# Patient Record
Sex: Male | Born: 1960
Health system: Southern US, Community
[De-identification: ages and names within clinical notes are randomized; demographics above are authoritative.]

## PROBLEM LIST (undated history)

## (undated) DIAGNOSIS — M545 Low back pain, unspecified: Secondary | ICD-10-CM

## (undated) DIAGNOSIS — I10 Essential (primary) hypertension: Secondary | ICD-10-CM

## (undated) HISTORY — PX: MOHS SURGERY: SUR867

## (undated) HISTORY — DX: Low back pain: M54.5

## (undated) HISTORY — PX: SKIN LESION EXCISION: SHX2412

## (undated) HISTORY — DX: Low back pain, unspecified: M54.50

## (undated) HISTORY — DX: Essential (primary) hypertension: I10

## (undated) HISTORY — PX: SKIN BIOPSY: SHX1

---

## 2012-01-14 ENCOUNTER — Ambulatory Visit: Payer: Self-pay | Admitting: Family Medicine

## 2014-01-04 ENCOUNTER — Ambulatory Visit: Payer: Self-pay | Admitting: Gastroenterology

## 2014-01-04 LAB — HM COLONOSCOPY

## 2014-10-28 DIAGNOSIS — I10 Essential (primary) hypertension: Secondary | ICD-10-CM | POA: Insufficient documentation

## 2014-10-29 ENCOUNTER — Ambulatory Visit (INDEPENDENT_AMBULATORY_CARE_PROVIDER_SITE_OTHER): Payer: BLUE CROSS/BLUE SHIELD | Admitting: Family Medicine

## 2014-10-29 ENCOUNTER — Encounter: Payer: Self-pay | Admitting: Family Medicine

## 2014-10-29 VITALS — BP 122/79 | HR 50 | Temp 97.5°F | Ht 70.6 in | Wt 238.0 lb

## 2014-10-29 DIAGNOSIS — I10 Essential (primary) hypertension: Secondary | ICD-10-CM

## 2014-10-29 DIAGNOSIS — E291 Testicular hypofunction: Secondary | ICD-10-CM | POA: Diagnosis not present

## 2014-10-29 DIAGNOSIS — Z0000 Encounter for general adult medical examination without abnormal findings: Secondary | ICD-10-CM | POA: Diagnosis not present

## 2014-10-29 LAB — URINALYSIS, ROUTINE W REFLEX MICROSCOPIC
Bilirubin, UA: NEGATIVE
GLUCOSE, UA: NEGATIVE
KETONES UA: NEGATIVE
Leukocytes, UA: NEGATIVE
NITRITE UA: NEGATIVE
Protein, UA: NEGATIVE
SPEC GRAV UA: 1.005 (ref 1.005–1.030)
UUROB: 0.2 mg/dL (ref 0.2–1.0)
pH, UA: 6 (ref 5.0–7.5)

## 2014-10-29 LAB — MICROSCOPIC EXAMINATION

## 2014-10-29 MED ORDER — BENAZEPRIL HCL 40 MG PO TABS: 40.0000 mg | ORAL_TABLET | Freq: Every day | ORAL | Status: DC

## 2014-10-29 NOTE — Assessment & Plan Note (Signed)
The current medical regimen is effective;  continue present plan and medications.  

## 2014-10-29 NOTE — Progress Notes (Signed)
   BP 122/79 mmHg  Pulse 50  Temp(Src) 97.5 F (36.4 C)  Ht 5' 10.6" (1.793 m)  Wt 238 lb (107.956 kg)  BMI 33.58 kg/m2  SpO2 98%   Subjective:    Patient ID: Larry Cochran, male    DOB: 04/19/1960, 54 y.o.   MRN: [id number]  HPI: Larry Cochran is a 54 y.o. male  Chief Complaint  Patient presents with  . Annual Exam  for BP check doing well   Relevant past medical, surgical, family and social history reviewed and updated as indicated. Interim medical history since our last visit reviewed. Allergies and medications reviewed and updated.  Review of Systems  Constitutional: Negative.   HENT: Negative.   Eyes: Negative.   Respiratory: Negative.   Cardiovascular: Negative.   Endocrine: Negative.   Musculoskeletal: Negative.   Skin: Negative.   Allergic/Immunologic: Negative.   Neurological: Negative.   Hematological: Negative.   Psychiatric/Behavioral: Negative.     Per HPI unless specifically indicated above     Objective:    BP 122/79 mmHg  Pulse 50  Temp(Src) 97.5 F (36.4 C)  Ht 5' 10.6" (1.793 m)  Wt 238 lb (107.956 kg)  BMI 33.58 kg/m2  SpO2 98%  Wt Readings from Last 3 Encounters:  10/29/14 238 lb (107.956 kg)  04/15/14 238 lb (107.956 kg)    Physical Exam  Constitutional: He is oriented to person, place, and time. He appears well-developed and well-nourished.  HENT:  Head: Normocephalic and atraumatic.  Right Ear: External ear normal.  Left Ear: External ear normal.  Eyes: Conjunctivae and EOM are normal. Pupils are equal, round, and reactive to light.  Neck: Normal range of motion. Neck supple.  Cardiovascular: Normal rate, regular rhythm, normal heart sounds and intact distal pulses.   Pulmonary/Chest: Effort normal and breath sounds normal.  Abdominal: Soft. Bowel sounds are normal. There is no splenomegaly or hepatomegaly.  Genitourinary: Rectum normal, prostate normal and penis normal.  Musculoskeletal: Normal range of motion.   Neurological: He is alert and oriented to person, place, and time. He has normal reflexes.  Skin: No rash noted. No erythema.  Psychiatric: He has a normal mood and affect. His behavior is normal. Judgment and thought content normal.    No results found for this or any previous visit.    Assessment & Plan:   Problem List Items Addressed This Visit      Cardiovascular and Mediastinum   Hypertension    The current medical regimen is effective;  continue present plan and medications.       Relevant Medications   sildenafil (REVATIO) 20 MG tablet   benazepril (LOTENSIN) 40 MG tablet   Other Relevant Orders   Basic metabolic panel     Endocrine   Hypogonadism in male - Primary    Other Visit Diagnoses    PE (physical exam), annual        Relevant Orders    Comprehensive metabolic panel    Lipid panel    CBC with Differential/Platelet    PSA    Urinalysis, Routine w reflex microscopic (not at [facility])    TSH        Follow up plan: Return in about 6 months (around 05/01/2015), or if symptoms worsen or fail to improve, for BP check.

## 2014-10-30 ENCOUNTER — Encounter: Payer: Self-pay | Admitting: Family Medicine

## 2014-10-30 LAB — CBC WITH DIFFERENTIAL/PLATELET
BASOS ABS: 0 10*3/uL (ref 0.0–0.2)
Basos: 0 %
EOS (ABSOLUTE): 0.1 10*3/uL (ref 0.0–0.4)
Eos: 2 %
HEMOGLOBIN: 14.4 g/dL (ref 12.6–17.7)
Hematocrit: 43 % (ref 37.5–51.0)
Immature Grans (Abs): 0 10*3/uL (ref 0.0–0.1)
Immature Granulocytes: 0 %
LYMPHS ABS: 2.4 10*3/uL (ref 0.7–3.1)
LYMPHS: 35 %
MCH: 28.6 pg (ref 26.6–33.0)
MCHC: 33.5 g/dL (ref 31.5–35.7)
MCV: 86 fL (ref 79–97)
MONOCYTES: 6 %
Monocytes Absolute: 0.4 10*3/uL (ref 0.1–0.9)
NEUTROS ABS: 3.8 10*3/uL (ref 1.4–7.0)
Neutrophils: 57 %
PLATELETS: 278 10*3/uL (ref 150–379)
RBC: 5.03 x10E6/uL (ref 4.14–5.80)
RDW: 14.3 % (ref 12.3–15.4)
WBC: 6.8 10*3/uL (ref 3.4–10.8)

## 2014-10-30 LAB — COMPREHENSIVE METABOLIC PANEL
A/G RATIO: 2.6 — AB (ref 1.1–2.5)
ALT: 26 IU/L (ref 0–44)
AST: 13 IU/L (ref 0–40)
Albumin: 4.6 g/dL (ref 3.5–5.5)
Alkaline Phosphatase: 74 IU/L (ref 39–117)
BUN/Creatinine Ratio: 16 (ref 9–20)
BUN: 15 mg/dL (ref 6–24)
Bilirubin Total: 0.4 mg/dL (ref 0.0–1.2)
CALCIUM: 9 mg/dL (ref 8.7–10.2)
CO2: 23 mmol/L (ref 18–29)
CREATININE: 0.95 mg/dL (ref 0.76–1.27)
Chloride: 97 mmol/L (ref 97–108)
GFR, EST AFRICAN AMERICAN: 106 mL/min/{1.73_m2} (ref >59)
GFR, EST NON AFRICAN AMERICAN: 92 mL/min/{1.73_m2} (ref >59)
GLOBULIN, TOTAL: 1.8 g/dL (ref 1.5–4.5)
Glucose: 105 mg/dL — ABNORMAL HIGH (ref 65–99)
POTASSIUM: 4.2 mmol/L (ref 3.5–5.2)
SODIUM: 139 mmol/L (ref 134–144)
TOTAL PROTEIN: 6.4 g/dL (ref 6.0–8.5)

## 2014-10-30 LAB — LIPID PANEL
CHOL/HDL RATIO: 6.4 ratio — AB (ref 0.0–5.0)
Cholesterol, Total: 230 mg/dL — ABNORMAL HIGH (ref 100–199)
HDL: 36 mg/dL — AB (ref >39)
LDL CALC: 150 mg/dL — AB (ref 0–99)
TRIGLYCERIDES: 221 mg/dL — AB (ref 0–149)
VLDL Cholesterol Cal: 44 mg/dL — ABNORMAL HIGH (ref 5–40)

## 2014-10-30 LAB — PSA: PROSTATE SPECIFIC AG, SERUM: 2 ng/mL (ref 0.0–4.0)

## 2014-10-30 LAB — TSH: TSH: 1.33 u[IU]/mL (ref 0.450–4.500)

## 2015-01-08 ENCOUNTER — Other Ambulatory Visit: Payer: Self-pay | Admitting: Family Medicine

## 2015-01-09 ENCOUNTER — Telehealth: Payer: Self-pay

## 2015-01-09 MED ORDER — BENAZEPRIL HCL 40 MG PO TABS: 40.0000 mg | ORAL_TABLET | Freq: Every day | ORAL | Status: DC

## 2015-01-09 NOTE — Telephone Encounter (Signed)
Rx sent to his pharmacy

## 2015-01-09 NOTE — Telephone Encounter (Signed)
LAST VISIT: 10/29/2014 UPCOMING APPT: 04/30/2015  Request for a 90 day supply on benazepril hcl 40 mg instead of a 30 day supply.

## 2015-02-25 ENCOUNTER — Telehealth: Payer: Self-pay | Admitting: Family Medicine

## 2015-02-25 MED ORDER — AZITHROMYCIN 250 MG PO TABS: ORAL_TABLET | ORAL | Status: DC

## 2015-02-25 MED ORDER — BENZONATATE 100 MG PO CAPS: 100.0000 mg | ORAL_CAPSULE | Freq: Two times a day (BID) | ORAL | Status: DC | PRN

## 2015-02-25 NOTE — Telephone Encounter (Signed)
Patient with bad head cold congestion cough has been taking Amoxil not getting better will call in other meds

## 2015-02-25 NOTE — Telephone Encounter (Signed)
[facility], he's taken a couple days worth and doesn't feel it's working, he is still coughing. Wondered if he needs something else.  Would like Dr Dossie Arbourrissman to call him.

## 2015-04-30 ENCOUNTER — Ambulatory Visit (INDEPENDENT_AMBULATORY_CARE_PROVIDER_SITE_OTHER): Payer: BLUE CROSS/BLUE SHIELD | Admitting: Family Medicine

## 2015-04-30 ENCOUNTER — Encounter: Payer: Self-pay | Admitting: Family Medicine

## 2015-04-30 VITALS — BP 104/71 | HR 73 | Temp 98.5°F | Ht 70.0 in | Wt 237.0 lb

## 2015-04-30 DIAGNOSIS — I10 Essential (primary) hypertension: Secondary | ICD-10-CM

## 2015-04-30 DIAGNOSIS — Z113 Encounter for screening for infections with a predominantly sexual mode of transmission: Secondary | ICD-10-CM

## 2015-04-30 DIAGNOSIS — R5382 Chronic fatigue, unspecified: Secondary | ICD-10-CM | POA: Diagnosis not present

## 2015-04-30 MED ORDER — BENAZEPRIL HCL 40 MG PO TABS: 40.0000 mg | ORAL_TABLET | Freq: Every day | ORAL | Status: DC

## 2015-04-30 NOTE — Progress Notes (Signed)
BP 104/71 mmHg  Pulse 73  Temp(Src) 98.5 F (36.9 C)  Ht  (1.778 m)  Wt 237 lb (107.502 kg)  BMI 34.01 kg/m2  SpO2 97%   Subjective:    Patient ID: Larry Cochran, male    DOB: 1960/12/28, 55 y.o.   MRN: [id number]  HPI: Larry Cochran is a 55 y.o. male  Chief Complaint  Patient presents with  . Hypertension   patient follow-up hypertension doing well with no complaints taking medication no problems Testosterone replacement managed by urology and stable   wife has noticed marked snoring with marked sleep apnea is gotten worse over the last year or so Patient has night sweats thrashes at night some daytime drowsiness and fatigue  no falling asleep while driving Dry mouth at night and patient is overweight.  Relevant past medical, surgical, family and social history reviewed and updated as indicated. Interim medical history since our last visit reviewed. Allergies and medications reviewed and updated.  Review of Systems  Constitutional: Negative.   Respiratory: Negative.   Cardiovascular: Negative.     Per HPI unless specifically indicated above     Objective:    BP 104/71 mmHg  Pulse 73  Temp(Src) 98.5 F (36.9 C)  Ht  (1.778 m)  Wt 237 lb (107.502 kg)  BMI 34.01 kg/m2  SpO2 97%  Wt Readings from Last 3 Encounters:  04/30/15 237 lb (107.502 kg)  10/29/14 238 lb (107.956 kg)  04/15/14 238 lb (107.956 kg)    Physical Exam  Constitutional: He is oriented to person, place, and time. He appears well-developed and well-nourished. No distress.  HENT:  Head: Normocephalic and atraumatic.  Right Ear: Hearing normal.  Left Ear: Hearing normal.  Nose: Nose normal.  Mouth/Throat: Oropharynx is clear and moist.  Eyes: Conjunctivae and lids are normal. Right eye exhibits no discharge. Left eye exhibits no discharge. No scleral icterus.  Neck: No thyromegaly present.  Cardiovascular: Normal rate, regular rhythm and normal heart sounds.    Pulmonary/Chest: Effort normal and breath sounds normal. No respiratory distress.  Musculoskeletal: Normal range of motion.  Lymphadenopathy:    He has no cervical adenopathy.  Neurological: He is alert and oriented to person, place, and time.  Skin: Skin is intact. No rash noted.  Psychiatric: He has a normal mood and affect. His speech is normal and behavior is normal. Judgment and thought content normal. Cognition and memory are normal.    Results for orders placed or performed in visit on 10/29/14  Microscopic Examination  Result Value Ref Range   WBC, UA 0-5 0 -  5 /hpf   RBC, UA 0-2 0 -  2 /hpf   Epithelial Cells (non renal) 0-10 0 - 10 /hpf   Bacteria, UA Few None seen/Few  Comprehensive metabolic panel  Result Value Ref Range   Glucose 105 (H) 65 - 99 mg/dL   BUN 15 6 - 24 mg/dL   Creatinine, Ser 1.47 0.76 - 1.27 mg/dL   GFR calc non Af Amer 92 >59 mL/min/1.73   GFR calc Af Amer 106 >59 mL/min/1.73   BUN/Creatinine Ratio 16 9 - 20   Sodium 139 134 - 144 mmol/L   Potassium 4.2 3.5 - 5.2 mmol/L   Chloride 97 97 - 108 mmol/L   CO2 23 18 - 29 mmol/L   Calcium 9.0 8.7 - 10.2 mg/dL   Total Protein 6.4 6.0 - 8.5 g/dL   Albumin 4.6 3.5 - 5.5 g/dL   Globulin, Total  1.8 1.5 - 4.5 g/dL   Albumin/Globulin Ratio 2.6 (H) 1.1 - 2.5   Bilirubin Total 0.4 0.0 - 1.2 mg/dL   Alkaline Phosphatase 74 39 - 117 IU/L   AST 13 0 - 40 IU/L   ALT 26 0 - 44 IU/L  Lipid panel  Result Value Ref Range   Cholesterol, Total 230 (H) 100 - 199 mg/dL   Triglycerides 161 (H) 0 - 149 mg/dL   HDL 36 (L) >09 mg/dL   VLDL Cholesterol Cal 44 (H) 5 - 40 mg/dL   LDL Calculated 604 (H) 0 - 99 mg/dL   Chol/HDL Ratio 6.4 (H) 0.0 - 5.0 ratio units  CBC with Differential/Platelet  Result Value Ref Range   WBC 6.8 3.4 - 10.8 x10E3/uL   RBC 5.03 4.14 - 5.80 x10E6/uL   Hemoglobin 14.4 12.6 - 17.7 g/dL   Hematocrit 54.0 98.1 - 51.0 %   MCV 86 79 - 97 fL   MCH 28.6 26.6 - 33.0 pg   MCHC 33.5 31.5 - 35.7  g/dL   RDW 19.1 47.8 - 29.5 %   Platelets 278 150 - 379 x10E3/uL   Neutrophils 57 %   Lymphs 35 %   Monocytes 6 %   Eos 2 %   Basos 0 %   Neutrophils Absolute 3.8 1.4 - 7.0 x10E3/uL   Lymphocytes Absolute 2.4 0.7 - 3.1 x10E3/uL   Monocytes Absolute 0.4 0.1 - 0.9 x10E3/uL   EOS (ABSOLUTE) 0.1 0.0 - 0.4 x10E3/uL   Basophils Absolute 0.0 0.0 - 0.2 x10E3/uL   Immature Granulocytes 0 %   Immature Grans (Abs) 0.0 0.0 - 0.1 x10E3/uL  PSA  Result Value Ref Range   Prostate Specific Ag, Serum 2.0 0.0 - 4.0 ng/mL  Urinalysis, Routine w reflex microscopic (not at [facility])  Result Value Ref Range   Specific Gravity, UA 1.005 1.005 - 1.030   pH, UA 6.0 5.0 - 7.5   Color, UA Yellow Yellow   Appearance Ur Clear Clear   Leukocytes, UA Negative Negative   Protein, UA Negative Negative/Trace   Glucose, UA Negative Negative   Ketones, UA Negative Negative   RBC, UA Trace (A) Negative   Bilirubin, UA Negative Negative   Urobilinogen, Ur 0.2 0.2 - 1.0 mg/dL   Nitrite, UA Negative Negative   Microscopic Examination See below:   TSH  Result Value Ref Range   TSH 1.330 0.450 - 4.500 uIU/mL      Assessment & Plan:   Problem List Items Addressed This Visit      Cardiovascular and Mediastinum   Hypertension   Relevant Medications   benazepril (LOTENSIN) 40 MG tablet    Other Visit Diagnoses    Routine screening for STI (sexually transmitted infection)    -  Primary    Relevant Orders    Hepatitis C Antibody    Chronic fatigue        order sleep study    Relevant Orders    Ambulatory referral to Sleep Studies        Follow up plan: Return in about 6 months (around 10/28/2015) for Physical Exam.

## 2015-05-01 ENCOUNTER — Encounter: Payer: Self-pay | Admitting: Family Medicine

## 2015-05-01 LAB — BASIC METABOLIC PANEL
BUN / CREAT RATIO: 14 (ref 9–20)
BUN: 15 mg/dL (ref 6–24)
CHLORIDE: 98 mmol/L (ref 96–106)
CO2: 24 mmol/L (ref 18–29)
Calcium: 8.6 mg/dL — ABNORMAL LOW (ref 8.7–10.2)
Creatinine, Ser: 1.07 mg/dL (ref 0.76–1.27)
GFR calc non Af Amer: 79 mL/min/{1.73_m2} (ref >59)
GFR, EST AFRICAN AMERICAN: 91 mL/min/{1.73_m2} (ref >59)
GLUCOSE: 104 mg/dL — AB (ref 65–99)
POTASSIUM: 4.3 mmol/L (ref 3.5–5.2)
SODIUM: 139 mmol/L (ref 134–144)

## 2015-05-01 LAB — HEPATITIS C ANTIBODY

## 2015-05-22 ENCOUNTER — Telehealth: Payer: Self-pay | Admitting: Family Medicine

## 2015-05-22 ENCOUNTER — Other Ambulatory Visit: Payer: Self-pay

## 2015-05-22 DIAGNOSIS — R5382 Chronic fatigue, unspecified: Secondary | ICD-10-CM

## 2015-05-22 NOTE — Telephone Encounter (Signed)
Due to insurance, pt will need to go to [facility] instead of [facility] for sleep study. The phone number is [phone].  

## 2015-05-22 NOTE — Telephone Encounter (Signed)
Due to insurance, pt will need to go to [facility] instead of [facility] for sleep study. The phone number is [phone].

## 2015-10-10 DIAGNOSIS — N5201 Erectile dysfunction due to arterial insufficiency: Secondary | ICD-10-CM | POA: Diagnosis not present

## 2015-10-10 DIAGNOSIS — N401 Enlarged prostate with lower urinary tract symptoms: Secondary | ICD-10-CM | POA: Diagnosis not present

## 2015-10-10 DIAGNOSIS — E291 Testicular hypofunction: Secondary | ICD-10-CM | POA: Diagnosis not present

## 2015-10-10 DIAGNOSIS — Z79899 Other long term (current) drug therapy: Secondary | ICD-10-CM | POA: Diagnosis not present

## 2015-10-13 DIAGNOSIS — N401 Enlarged prostate with lower urinary tract symptoms: Secondary | ICD-10-CM | POA: Diagnosis not present

## 2015-10-13 DIAGNOSIS — E291 Testicular hypofunction: Secondary | ICD-10-CM | POA: Diagnosis not present

## 2015-10-13 DIAGNOSIS — Z79899 Other long term (current) drug therapy: Secondary | ICD-10-CM | POA: Diagnosis not present

## 2015-10-27 ENCOUNTER — Other Ambulatory Visit: Payer: Self-pay | Admitting: Family Medicine

## 2015-10-27 DIAGNOSIS — I10 Essential (primary) hypertension: Secondary | ICD-10-CM

## 2015-11-11 ENCOUNTER — Encounter: Payer: Self-pay | Admitting: Family Medicine

## 2015-11-11 ENCOUNTER — Ambulatory Visit (INDEPENDENT_AMBULATORY_CARE_PROVIDER_SITE_OTHER): Payer: BLUE CROSS/BLUE SHIELD | Admitting: Family Medicine

## 2015-11-11 VITALS — BP 116/74 | HR 68 | Temp 98.2°F | Ht 71.2 in | Wt 243.0 lb

## 2015-11-11 DIAGNOSIS — R5382 Chronic fatigue, unspecified: Secondary | ICD-10-CM | POA: Diagnosis not present

## 2015-11-11 DIAGNOSIS — R5383 Other fatigue: Secondary | ICD-10-CM | POA: Insufficient documentation

## 2015-11-11 DIAGNOSIS — I10 Essential (primary) hypertension: Secondary | ICD-10-CM | POA: Diagnosis not present

## 2015-11-11 DIAGNOSIS — E291 Testicular hypofunction: Secondary | ICD-10-CM | POA: Diagnosis not present

## 2015-11-11 DIAGNOSIS — Z0000 Encounter for general adult medical examination without abnormal findings: Secondary | ICD-10-CM | POA: Diagnosis not present

## 2015-11-11 LAB — URINALYSIS, ROUTINE W REFLEX MICROSCOPIC
BILIRUBIN UA: NEGATIVE
Glucose, UA: NEGATIVE
KETONES UA: NEGATIVE
Leukocytes, UA: NEGATIVE
Nitrite, UA: NEGATIVE
PH UA: 6 (ref 5.0–7.5)
PROTEIN UA: NEGATIVE
UUROB: 0.2 mg/dL (ref 0.2–1.0)

## 2015-11-11 LAB — MICROSCOPIC EXAMINATION: WBC, UA: NONE SEEN /hpf (ref 0–?)

## 2015-11-11 MED ORDER — BENAZEPRIL HCL 40 MG PO TABS: 40.0000 mg | ORAL_TABLET | Freq: Every day | ORAL | 4 refills | Status: DC

## 2015-11-11 NOTE — Assessment & Plan Note (Signed)
The current medical regimen is effective;  continue present plan and medications.  

## 2015-11-11 NOTE — Assessment & Plan Note (Signed)
Fatigue possibly multifactorial will do sleep study check testosterone

## 2015-11-11 NOTE — Assessment & Plan Note (Signed)
Followed by urology. We will check testosterone again today because of previous low testosterone levels

## 2015-11-11 NOTE — Progress Notes (Signed)
BP 116/74 (BP Location: Left Arm, Patient Position: Sitting, Cuff Size: Normal)   Pulse 68   Temp 98.2 F (36.8 C)   Ht 5' 11.2" (1.808 m)   Wt 243 lb (110.2 kg)   SpO2 96%   BMI 33.70 kg/m    Subjective:    Patient ID: Larry Cochran, male    DOB: 1960-10-26, 55 y.o.   MRN: [id number]  HPI: Larry Cochran is a 55 y.o. male  Chief Complaint  Patient presents with  . Annual Exam    interested in checking testosterone level, re enter Sleep Study Order with [facility]  Is been using testosterone cream daily had blood work done couple weeks ago which was abnormally low because previously had been normal. Concern was lab error will recheck testosterone today. Blood pressure doing well no complaints Reflux stable on Prevacid once a day And rare use of scalp lotion  pt also with occasional stopped up ear sensation rarely right ear which with Valsalva, and ear clearing maneuver is unable to open up. Patient due to insurance hassles never got sleep study 6 months ago when ordered symptoms of sleep apnea persists especially night sweats depression screener filled out see copy for details will refer for sleep study at [facility] sleep lab.  Relevant past medical, surgical, family and social history reviewed and updated as indicated. Interim medical history since our last visit reviewed. Allergies and medications reviewed and updated.  Review of Systems  Constitutional: Negative.   HENT: Negative.   Eyes: Negative.   Respiratory: Negative.   Cardiovascular: Negative.   Gastrointestinal: Negative.   Endocrine: Negative.   Genitourinary: Negative.   Musculoskeletal: Negative.   Skin: Negative.   Allergic/Immunologic: Negative.   Neurological: Negative.   Hematological: Negative.   Psychiatric/Behavioral: Negative.     Per HPI unless specifically indicated above     Objective:    BP 116/74 (BP Location: Left Arm, Patient Position: Sitting, Cuff Size: Normal)   Pulse 68   Temp 98.2  F (36.8 C)   Ht 5' 11.2" (1.808 m)   Wt 243 lb (110.2 kg)   SpO2 96%   BMI 33.70 kg/m   Wt Readings from Last 3 Encounters:  11/11/15 243 lb (110.2 kg)  04/30/15 237 lb (107.5 kg)  10/29/14 238 lb (108 kg)    Physical Exam  Constitutional: He is oriented to person, place, and time. He appears well-developed and well-nourished.  HENT:  Head: Normocephalic and atraumatic.  Right Ear: External ear normal.  Left Ear: External ear normal.  Eyes: Conjunctivae and EOM are normal. Pupils are equal, round, and reactive to light.  Neck: Normal range of motion. Neck supple.  Cardiovascular: Normal rate, regular rhythm, normal heart sounds and intact distal pulses.   Pulmonary/Chest: Effort normal and breath sounds normal.  Abdominal: Soft. Bowel sounds are normal. There is no splenomegaly or hepatomegaly.  Genitourinary: Rectum normal, prostate normal and penis normal.  Musculoskeletal: Normal range of motion.  Neurological: He is alert and oriented to person, place, and time. He has normal reflexes.  Skin: No rash noted. No erythema.  Psychiatric: He has a normal mood and affect. His behavior is normal. Judgment and thought content normal.    Results for orders placed or performed in visit on 04/30/15  Basic metabolic panel  Result Value Ref Range   Glucose 104 (H) 65 - 99 mg/dL   BUN 15 6 - 24 mg/dL   Creatinine, Ser 4.09 0.76 - 1.27 mg/dL  GFR calc non Af Amer 79 >59 mL/min/1.73   GFR calc Af Amer 91 >59 mL/min/1.73   BUN/Creatinine Ratio 14 9 - 20   Sodium 139 134 - 144 mmol/L   Potassium 4.3 3.5 - 5.2 mmol/L   Chloride 98 96 - 106 mmol/L   CO2 24 18 - 29 mmol/L   Calcium 8.6 (L) 8.7 - 10.2 mg/dL  Hepatitis C Antibody  Result Value Ref Range   Hep C Virus Ab <0.1 0.0 - 0.9 s/co ratio      Assessment & Plan:   Problem List Items Addressed This Visit      Cardiovascular and Mediastinum   Hypertension    The current medical regimen is effective;  continue present plan  and medications.       Relevant Medications   benazepril (LOTENSIN) 40 MG tablet     Endocrine   Hypogonadism in male    Followed by urology. We will check testosterone again today because of previous low testosterone levels      Relevant Orders   Testosterone     Other   Fatigue    Fatigue possibly multifactorial will do sleep study check testosterone      Relevant Orders   Ambulatory referral to Sleep Studies    Other Visit Diagnoses    Annual physical exam    -  Primary   Relevant Orders   CBC with Differential/Platelet   Comprehensive metabolic panel   Lipid Panel w/o Chol/HDL Ratio   TSH   Urinalysis, Routine w reflex microscopic (not at [facility])       Follow up plan: Return in about 6 months (around 05/10/2016) for BMP.

## 2015-11-12 ENCOUNTER — Encounter: Payer: Self-pay | Admitting: Family Medicine

## 2015-11-12 ENCOUNTER — Telehealth: Payer: Self-pay | Admitting: Family Medicine

## 2015-11-12 LAB — TSH: TSH: 1.56 u[IU]/mL (ref 0.450–4.500)

## 2015-11-12 LAB — COMPREHENSIVE METABOLIC PANEL
ALT: 19 IU/L (ref 0–44)
AST: 14 IU/L (ref 0–40)
Albumin/Globulin Ratio: 1.9 (ref 1.2–2.2)
Albumin: 4.2 g/dL (ref 3.5–5.5)
Alkaline Phosphatase: 74 IU/L (ref 39–117)
BUN/Creatinine Ratio: 14 (ref 9–20)
BUN: 14 mg/dL (ref 6–24)
Bilirubin Total: 0.2 mg/dL (ref 0.0–1.2)
CALCIUM: 9.1 mg/dL (ref 8.7–10.2)
CO2: 23 mmol/L (ref 18–29)
Chloride: 100 mmol/L (ref 96–106)
Creatinine, Ser: 1.03 mg/dL (ref 0.76–1.27)
GFR calc Af Amer: 95 mL/min/{1.73_m2} (ref >59)
GFR, EST NON AFRICAN AMERICAN: 83 mL/min/{1.73_m2} (ref >59)
GLOBULIN, TOTAL: 2.2 g/dL (ref 1.5–4.5)
Glucose: 115 mg/dL — ABNORMAL HIGH (ref 65–99)
Potassium: 4.4 mmol/L (ref 3.5–5.2)
SODIUM: 140 mmol/L (ref 134–144)
TOTAL PROTEIN: 6.4 g/dL (ref 6.0–8.5)

## 2015-11-12 LAB — LIPID PANEL W/O CHOL/HDL RATIO
Cholesterol, Total: 222 mg/dL — ABNORMAL HIGH (ref 100–199)
HDL: 38 mg/dL — AB (ref >39)
LDL CALC: 148 mg/dL — AB (ref 0–99)
TRIGLYCERIDES: 179 mg/dL — AB (ref 0–149)
VLDL Cholesterol Cal: 36 mg/dL (ref 5–40)

## 2015-11-12 LAB — CBC WITH DIFFERENTIAL/PLATELET
Basophils Absolute: 0 10*3/uL (ref 0.0–0.2)
Basos: 0 %
EOS (ABSOLUTE): 0.1 10*3/uL (ref 0.0–0.4)
EOS: 1 %
HEMATOCRIT: 46 % (ref 37.5–51.0)
HEMOGLOBIN: 15.2 g/dL (ref 12.6–17.7)
IMMATURE GRANS (ABS): 0 10*3/uL (ref 0.0–0.1)
IMMATURE GRANULOCYTES: 0 %
Lymphocytes Absolute: 2.3 10*3/uL (ref 0.7–3.1)
Lymphs: 25 %
MCH: 29.4 pg (ref 26.6–33.0)
MCHC: 33 g/dL (ref 31.5–35.7)
MCV: 89 fL (ref 79–97)
MONOCYTES: 6 %
MONOS ABS: 0.5 10*3/uL (ref 0.1–0.9)
NEUTROS PCT: 68 %
Neutrophils Absolute: 6.2 10*3/uL (ref 1.4–7.0)
Platelets: 262 10*3/uL (ref 150–379)
RBC: 5.17 x10E6/uL (ref 4.14–5.80)
RDW: 13.8 % (ref 12.3–15.4)
WBC: 9.2 10*3/uL (ref 3.4–10.8)

## 2015-11-12 LAB — TESTOSTERONE: TESTOSTERONE: 998 ng/dL — AB (ref 264–916)

## 2015-11-12 NOTE — Telephone Encounter (Signed)
Phone call Discussed elevated cholesterol with patient need to do better with diet Discuss elevated testosterone patient will forward to urology to further evaluate

## 2015-11-12 NOTE — Telephone Encounter (Signed)
-----   Message from Lurlean HornsNancy H Wilson, CMA sent at 11/12/2015 12:25 PM EDT ----- labs

## 2015-12-18 ENCOUNTER — Ambulatory Visit: Payer: BLUE CROSS/BLUE SHIELD | Attending: Specialist

## 2015-12-18 DIAGNOSIS — G4733 Obstructive sleep apnea (adult) (pediatric): Secondary | ICD-10-CM | POA: Insufficient documentation

## 2015-12-18 DIAGNOSIS — G4761 Periodic limb movement disorder: Secondary | ICD-10-CM | POA: Diagnosis not present

## 2016-01-13 ENCOUNTER — Telehealth: Payer: Self-pay | Admitting: Family Medicine

## 2016-01-13 NOTE — Telephone Encounter (Signed)
Left message at [facility] to return my call.

## 2016-01-13 NOTE — Telephone Encounter (Signed)
Pt called is concerned stated he had his sleep study and has not received results and the CPAP company has not yet received a RX. Please contact pt to follow up. Thanks.

## 2016-01-13 NOTE — Telephone Encounter (Signed)
Keri, do you mind calling [facility] and check on the status of the order form for his CPAP? We got his results but not an order for what he needs.

## 2016-01-22 NOTE — Telephone Encounter (Signed)
[facility], they said to call their other department at [phone].

## 2016-01-22 NOTE — Telephone Encounter (Signed)
Left message at their other location asking for the CPAP order form. Asked for her to return my call or to fax the order to our office.

## 2016-01-22 NOTE — Telephone Encounter (Signed)
Pt called and would like to know the status of the order for his CPAP

## 2016-01-22 NOTE — Telephone Encounter (Signed)
I have attempted to contact [facility]. Left message for them to return my call.   Amy, could you help reach out to [facility]?

## 2016-01-27 NOTE — Telephone Encounter (Signed)
Spoke with Angie, form to be faxed over.

## 2016-02-03 NOTE — Telephone Encounter (Signed)
Received order from [facility]. Order faxed back to them.

## 2016-02-20 DIAGNOSIS — G4733 Obstructive sleep apnea (adult) (pediatric): Secondary | ICD-10-CM | POA: Diagnosis not present

## 2016-03-22 DIAGNOSIS — G4733 Obstructive sleep apnea (adult) (pediatric): Secondary | ICD-10-CM | POA: Diagnosis not present

## 2016-04-16 DIAGNOSIS — N5201 Erectile dysfunction due to arterial insufficiency: Secondary | ICD-10-CM | POA: Diagnosis not present

## 2016-04-16 DIAGNOSIS — E291 Testicular hypofunction: Secondary | ICD-10-CM | POA: Diagnosis not present

## 2016-04-16 DIAGNOSIS — Z79899 Other long term (current) drug therapy: Secondary | ICD-10-CM | POA: Diagnosis not present

## 2016-04-16 DIAGNOSIS — N401 Enlarged prostate with lower urinary tract symptoms: Secondary | ICD-10-CM | POA: Diagnosis not present

## 2016-04-22 DIAGNOSIS — G4733 Obstructive sleep apnea (adult) (pediatric): Secondary | ICD-10-CM | POA: Diagnosis not present

## 2016-05-10 ENCOUNTER — Ambulatory Visit: Payer: BLUE CROSS/BLUE SHIELD | Admitting: Family Medicine

## 2016-05-20 DIAGNOSIS — G4733 Obstructive sleep apnea (adult) (pediatric): Secondary | ICD-10-CM | POA: Diagnosis not present

## 2016-05-24 ENCOUNTER — Ambulatory Visit (INDEPENDENT_AMBULATORY_CARE_PROVIDER_SITE_OTHER): Payer: BLUE CROSS/BLUE SHIELD | Admitting: Family Medicine

## 2016-05-24 ENCOUNTER — Encounter: Payer: Self-pay | Admitting: Family Medicine

## 2016-05-24 VITALS — BP 112/76 | HR 61 | Ht 71.0 in | Wt 236.0 lb

## 2016-05-24 DIAGNOSIS — Z9989 Dependence on other enabling machines and devices: Secondary | ICD-10-CM

## 2016-05-24 DIAGNOSIS — E291 Testicular hypofunction: Secondary | ICD-10-CM

## 2016-05-24 DIAGNOSIS — G4733 Obstructive sleep apnea (adult) (pediatric): Secondary | ICD-10-CM | POA: Diagnosis not present

## 2016-05-24 DIAGNOSIS — I10 Essential (primary) hypertension: Secondary | ICD-10-CM

## 2016-05-24 MED ORDER — CALCIPOTRIENE-BETAMETH DIPROP 0.005-0.064 % EX SUSP: CUTANEOUS | 0 refills | Status: DC | PRN

## 2016-05-24 NOTE — Assessment & Plan Note (Signed)
On CPAP. ?

## 2016-05-24 NOTE — Assessment & Plan Note (Signed)
The current medical regimen is effective;  continue present plan and medications.  

## 2016-05-24 NOTE — Assessment & Plan Note (Signed)
Followed by urology and normal

## 2016-05-24 NOTE — Progress Notes (Signed)
BP 112/76   Pulse 61   Ht 5\' 11"  (1.803 m)   Wt 236 lb (107 kg)   SpO2 98%   BMI 32.92 kg/m    Subjective:    Patient ID: Larry Cochran, male    DOB: May 05, 1960, 56 y.o.   MRN: [id number]  HPI: Larry Cochran is a 56 y.o. male  Chief Complaint  Patient presents with  . Follow-up  Patient follow-up doing well with good blood pressure control no issues blood work done at [facility] which was reviewed and normal. Takes Benzapril without problems. Reflux stable. Followed for testosterone replacement by urology. With normal blood work on review.  Relevant past medical, surgical, family and social history reviewed and updated as indicated. Interim medical history since our last visit reviewed. Allergies and medications reviewed and updated.  Review of Systems  Constitutional: Negative.   Respiratory: Negative.   Cardiovascular: Negative.     Per HPI unless specifically indicated above     Objective:    BP 112/76   Pulse 61   Ht 5\' 11"  (1.803 m)   Wt 236 lb (107 kg)   SpO2 98%   BMI 32.92 kg/m   Wt Readings from Last 3 Encounters:  05/24/16 236 lb (107 kg)  11/11/15 243 lb (110.2 kg)  04/30/15 237 lb (107.5 kg)    Physical Exam  Constitutional: He is oriented to person, place, and time. He appears well-developed and well-nourished.  HENT:  Head: Normocephalic and atraumatic.  Eyes: Conjunctivae and EOM are normal.  Neck: Normal range of motion.  Cardiovascular: Normal rate, regular rhythm and normal heart sounds.   Pulmonary/Chest: Effort normal and breath sounds normal.  Musculoskeletal: Normal range of motion.  Neurological: He is alert and oriented to person, place, and time.  Skin: No erythema.  Psychiatric: He has a normal mood and affect. His behavior is normal. Judgment and thought content normal.    Results for orders placed or performed in visit on 11/11/15  Microscopic Examination  Result Value Ref Range   WBC, UA None seen 0 - 5 /hpf   RBC,  UA 0-2 0 - 2 /hpf   Epithelial Cells (non renal) 0-10 0 - 10 /hpf  CBC with Differential/Platelet  Result Value Ref Range   WBC 9.2 3.4 - 10.8 x10E3/uL   RBC 5.17 4.14 - 5.80 x10E6/uL   Hemoglobin 15.2 12.6 - 17.7 g/dL   Hematocrit 40.9 81.1 - 51.0 %   MCV 89 79 - 97 fL   MCH 29.4 26.6 - 33.0 pg   MCHC 33.0 31.5 - 35.7 g/dL   RDW 91.4 78.2 - 95.6 %   Platelets 262 150 - 379 x10E3/uL   Neutrophils 68 %   Lymphs 25 %   Monocytes 6 %   Eos 1 %   Basos 0 %   Neutrophils Absolute 6.2 1.4 - 7.0 x10E3/uL   Lymphocytes Absolute 2.3 0.7 - 3.1 x10E3/uL   Monocytes Absolute 0.5 0.1 - 0.9 x10E3/uL   EOS (ABSOLUTE) 0.1 0.0 - 0.4 x10E3/uL   Basophils Absolute 0.0 0.0 - 0.2 x10E3/uL   Immature Granulocytes 0 %   Immature Grans (Abs) 0.0 0.0 - 0.1 x10E3/uL  Comprehensive metabolic panel  Result Value Ref Range   Glucose 115 (H) 65 - 99 mg/dL   BUN 14 6 - 24 mg/dL   Creatinine, Ser 2.13 0.76 - 1.27 mg/dL   GFR calc non Af Amer 83 >59 mL/min/1.73   GFR calc Af Amer 95 >59  mL/min/1.73   BUN/Creatinine Ratio 14 9 - 20   Sodium 140 134 - 144 mmol/L   Potassium 4.4 3.5 - 5.2 mmol/L   Chloride 100 96 - 106 mmol/L   CO2 23 18 - 29 mmol/L   Calcium 9.1 8.7 - 10.2 mg/dL   Total Protein 6.4 6.0 - 8.5 g/dL   Albumin 4.2 3.5 - 5.5 g/dL   Globulin, Total 2.2 1.5 - 4.5 g/dL   Albumin/Globulin Ratio 1.9 1.2 - 2.2   Bilirubin Total 0.2 0.0 - 1.2 mg/dL   Alkaline Phosphatase 74 39 - 117 IU/L   AST 14 0 - 40 IU/L   ALT 19 0 - 44 IU/L  Lipid Panel w/o Chol/HDL Ratio  Result Value Ref Range   Cholesterol, Total 222 (H) 100 - 199 mg/dL   Triglycerides 161179 (H) 0 - 149 mg/dL   HDL 38 (L) >09>39 mg/dL   VLDL Cholesterol Cal 36 5 - 40 mg/dL   LDL Calculated 604148 (H) 0 - 99 mg/dL  TSH  Result Value Ref Range   TSH 1.560 0.450 - 4.500 uIU/mL  Urinalysis, Routine w reflex microscopic (not at [facility])  Result Value Ref Range   Specific Gravity, UA <1.005 (L) 1.005 - 1.030   pH, UA 6.0 5.0 - 7.5   Color, UA  Yellow Yellow   Appearance Ur Clear Clear   Leukocytes, UA Negative Negative   Protein, UA Negative Negative/Trace   Glucose, UA Negative Negative   Ketones, UA Negative Negative   RBC, UA 1+ (A) Negative   Bilirubin, UA Negative Negative   Urobilinogen, Ur 0.2 0.2 - 1.0 mg/dL   Nitrite, UA Negative Negative   Microscopic Examination See below:   Testosterone  Result Value Ref Range   Testosterone 998 (H) 264 - 916 ng/dL      Assessment & Plan:   Problem List Items Addressed This Visit      Cardiovascular and Mediastinum   Hypertension - Primary    The current medical regimen is effective;  continue present plan and medications.       Relevant Orders   Basic metabolic panel     Respiratory   OSA on CPAP    On CPAP        Endocrine   Hypogonadism in male    Followed by urology and normal         Reviewed fatigue problems have been resolved with use of CPAP. Follow up plan: Return in about 6 months (around 11/24/2016) for Physical Exam.

## 2016-06-20 DIAGNOSIS — G4733 Obstructive sleep apnea (adult) (pediatric): Secondary | ICD-10-CM | POA: Diagnosis not present

## 2016-07-20 DIAGNOSIS — G4733 Obstructive sleep apnea (adult) (pediatric): Secondary | ICD-10-CM | POA: Diagnosis not present

## 2016-08-20 DIAGNOSIS — G4733 Obstructive sleep apnea (adult) (pediatric): Secondary | ICD-10-CM | POA: Diagnosis not present

## 2016-08-27 DIAGNOSIS — H2511 Age-related nuclear cataract, right eye: Secondary | ICD-10-CM | POA: Diagnosis not present

## 2016-09-19 DIAGNOSIS — G4733 Obstructive sleep apnea (adult) (pediatric): Secondary | ICD-10-CM | POA: Diagnosis not present

## 2016-10-20 DIAGNOSIS — G4733 Obstructive sleep apnea (adult) (pediatric): Secondary | ICD-10-CM | POA: Diagnosis not present

## 2016-10-29 DIAGNOSIS — E291 Testicular hypofunction: Secondary | ICD-10-CM | POA: Diagnosis not present

## 2016-10-29 DIAGNOSIS — N401 Enlarged prostate with lower urinary tract symptoms: Secondary | ICD-10-CM | POA: Diagnosis not present

## 2016-10-29 DIAGNOSIS — N5201 Erectile dysfunction due to arterial insufficiency: Secondary | ICD-10-CM | POA: Diagnosis not present

## 2016-10-29 DIAGNOSIS — Z79899 Other long term (current) drug therapy: Secondary | ICD-10-CM | POA: Diagnosis not present

## 2016-11-20 DIAGNOSIS — G4733 Obstructive sleep apnea (adult) (pediatric): Secondary | ICD-10-CM | POA: Diagnosis not present

## 2016-11-26 DIAGNOSIS — L400 Psoriasis vulgaris: Secondary | ICD-10-CM | POA: Diagnosis not present

## 2016-12-01 ENCOUNTER — Encounter: Payer: Self-pay | Admitting: Family Medicine

## 2016-12-01 ENCOUNTER — Ambulatory Visit (INDEPENDENT_AMBULATORY_CARE_PROVIDER_SITE_OTHER): Payer: BLUE CROSS/BLUE SHIELD | Admitting: Family Medicine

## 2016-12-01 VITALS — BP 108/70 | HR 94 | Ht 73.62 in | Wt 225.0 lb

## 2016-12-01 DIAGNOSIS — E291 Testicular hypofunction: Secondary | ICD-10-CM

## 2016-12-01 DIAGNOSIS — Z0000 Encounter for general adult medical examination without abnormal findings: Secondary | ICD-10-CM | POA: Diagnosis not present

## 2016-12-01 DIAGNOSIS — Z9989 Dependence on other enabling machines and devices: Secondary | ICD-10-CM

## 2016-12-01 DIAGNOSIS — I10 Essential (primary) hypertension: Secondary | ICD-10-CM | POA: Diagnosis not present

## 2016-12-01 DIAGNOSIS — G4733 Obstructive sleep apnea (adult) (pediatric): Secondary | ICD-10-CM

## 2016-12-01 LAB — URINALYSIS, ROUTINE W REFLEX MICROSCOPIC
Bilirubin, UA: NEGATIVE
GLUCOSE, UA: NEGATIVE
Ketones, UA: NEGATIVE
LEUKOCYTES UA: NEGATIVE
Nitrite, UA: NEGATIVE
PH UA: 6 (ref 5.0–7.5)
PROTEIN UA: NEGATIVE
Specific Gravity, UA: 1.015 (ref 1.005–1.030)
Urobilinogen, Ur: 0.2 mg/dL (ref 0.2–1.0)

## 2016-12-01 LAB — MICROSCOPIC EXAMINATION: BACTERIA UA: NONE SEEN

## 2016-12-01 MED ORDER — BENAZEPRIL HCL 40 MG PO TABS: 40.0000 mg | ORAL_TABLET | Freq: Every day | ORAL | 4 refills | Status: DC

## 2016-12-01 NOTE — Assessment & Plan Note (Signed)
Followed by urology and stable 

## 2016-12-01 NOTE — Assessment & Plan Note (Signed)
The current medical regimen is effective;  continue present plan and medications.  

## 2016-12-01 NOTE — Progress Notes (Signed)
BP 108/70   Pulse 94   Ht 6' 1.62" (1.87 m)   Wt 225 lb (102.1 kg)   SpO2 99%   BMI 29.19 kg/m    Subjective:    Patient ID: Larry Cochran, male    DOB: November 17, 1960, 56 y.o.   MRN: [id number]  HPI: Larry Cochran is a 56 y.o. male  Chief Complaint  Patient presents with  . Annual Exam  Patient follow-up blood pressure doing well no complaints good blood pressure control no issues with medications. Uses CPAP faithfully without problems. No daytime drowsiness or issues. Hypogonadal doing well followed by Dr. Sheppard Penton with blood work done last month and reported as normal  Relevant past medical, surgical, family and social history reviewed and updated as indicated. Interim medical history since our last visit reviewed. Allergies and medications reviewed and updated.  Review of Systems  Constitutional: Negative.   HENT: Negative.   Eyes: Negative.   Respiratory: Negative.   Cardiovascular: Negative.   Gastrointestinal: Negative.   Endocrine: Negative.   Genitourinary: Negative.   Musculoskeletal: Negative.   Skin: Negative.   Allergic/Immunologic: Negative.   Neurological: Negative.   Hematological: Negative.   Psychiatric/Behavioral: Negative.     Per HPI unless specifically indicated above     Objective:    BP 108/70   Pulse 94   Ht 6' 1.62" (1.87 m)   Wt 225 lb (102.1 kg)   SpO2 99%   BMI 29.19 kg/m   Wt Readings from Last 3 Encounters:  12/01/16 225 lb (102.1 kg)  05/24/16 236 lb (107 kg)  11/11/15 243 lb (110.2 kg)   great wt loss with doing weight watchers. Physical Exam  Constitutional: He is oriented to person, place, and time. He appears well-developed and well-nourished.  HENT:  Head: Normocephalic.  Right Ear: External ear normal.  Left Ear: External ear normal.  Nose: Nose normal.  Eyes: Pupils are equal, round, and reactive to light. Conjunctivae and EOM are normal.  Neck: Normal range of motion. Neck supple. No thyromegaly present.    Cardiovascular: Normal rate, regular rhythm, normal heart sounds and intact distal pulses.   Pulmonary/Chest: Effort normal and breath sounds normal.  Abdominal: Soft. Bowel sounds are normal. There is no splenomegaly or hepatomegaly.  Genitourinary: Penis normal.  Genitourinary Comments: Done at urology  Musculoskeletal: Normal range of motion.  Lymphadenopathy:    He has no cervical adenopathy.  Neurological: He is alert and oriented to person, place, and time. He has normal reflexes.  Skin: Skin is warm and dry.  Psychiatric: He has a normal mood and affect. His behavior is normal. Judgment and thought content normal.    Results for orders placed or performed in visit on 11/11/15  Microscopic Examination  Result Value Ref Range   WBC, UA None seen 0 - 5 /hpf   RBC, UA 0-2 0 - 2 /hpf   Epithelial Cells (non renal) 0-10 0 - 10 /hpf  CBC with Differential/Platelet  Result Value Ref Range   WBC 9.2 3.4 - 10.8 x10E3/uL   RBC 5.17 4.14 - 5.80 x10E6/uL   Hemoglobin 15.2 12.6 - 17.7 g/dL   Hematocrit 40.9 81.1 - 51.0 %   MCV 89 79 - 97 fL   MCH 29.4 26.6 - 33.0 pg   MCHC 33.0 31.5 - 35.7 g/dL   RDW 91.4 78.2 - 95.6 %   Platelets 262 150 - 379 x10E3/uL   Neutrophils 68 %   Lymphs 25 %   Monocytes 6 %  Eos 1 %   Basos 0 %   Neutrophils Absolute 6.2 1.4 - 7.0 x10E3/uL   Lymphocytes Absolute 2.3 0.7 - 3.1 x10E3/uL   Monocytes Absolute 0.5 0.1 - 0.9 x10E3/uL   EOS (ABSOLUTE) 0.1 0.0 - 0.4 x10E3/uL   Basophils Absolute 0.0 0.0 - 0.2 x10E3/uL   Immature Granulocytes 0 %   Immature Grans (Abs) 0.0 0.0 - 0.1 x10E3/uL  Comprehensive metabolic panel  Result Value Ref Range   Glucose 115 (H) 65 - 99 mg/dL   BUN 14 6 - 24 mg/dL   Creatinine, Ser 1.61 0.76 - 1.27 mg/dL   GFR calc non Af Amer 83 >59 mL/min/1.73   GFR calc Af Amer 95 >59 mL/min/1.73   BUN/Creatinine Ratio 14 9 - 20   Sodium 140 134 - 144 mmol/L   Potassium 4.4 3.5 - 5.2 mmol/L   Chloride 100 96 - 106 mmol/L   CO2  23 18 - 29 mmol/L   Calcium 9.1 8.7 - 10.2 mg/dL   Total Protein 6.4 6.0 - 8.5 g/dL   Albumin 4.2 3.5 - 5.5 g/dL   Globulin, Total 2.2 1.5 - 4.5 g/dL   Albumin/Globulin Ratio 1.9 1.2 - 2.2   Bilirubin Total 0.2 0.0 - 1.2 mg/dL   Alkaline Phosphatase 74 39 - 117 IU/L   AST 14 0 - 40 IU/L   ALT 19 0 - 44 IU/L  Lipid Panel w/o Chol/HDL Ratio  Result Value Ref Range   Cholesterol, Total 222 (H) 100 - 199 mg/dL   Triglycerides 096 (H) 0 - 149 mg/dL   HDL 38 (L) >04 mg/dL   VLDL Cholesterol Cal 36 5 - 40 mg/dL   LDL Calculated 540 (H) 0 - 99 mg/dL  TSH  Result Value Ref Range   TSH 1.560 0.450 - 4.500 uIU/mL  Urinalysis, Routine w reflex microscopic (not at [facility])  Result Value Ref Range   Specific Gravity, UA <1.005 (L) 1.005 - 1.030   pH, UA 6.0 5.0 - 7.5   Color, UA Yellow Yellow   Appearance Ur Clear Clear   Leukocytes, UA Negative Negative   Protein, UA Negative Negative/Trace   Glucose, UA Negative Negative   Ketones, UA Negative Negative   RBC, UA 1+ (A) Negative   Bilirubin, UA Negative Negative   Urobilinogen, Ur 0.2 0.2 - 1.0 mg/dL   Nitrite, UA Negative Negative   Microscopic Examination See below:   Testosterone  Result Value Ref Range   Testosterone 998 (H) 264 - 916 ng/dL      Assessment & Plan:   Problem List Items Addressed This Visit      Cardiovascular and Mediastinum   Hypertension - Primary    The current medical regimen is effective;  continue present plan and medications.       Relevant Medications   benazepril (LOTENSIN) 40 MG tablet   Other Relevant Orders   CBC with Differential/Platelet     Respiratory   OSA on CPAP    The current medical regimen is effective;  continue present plan and medications.         Endocrine   Hypogonadism in male    Followed by urology and stable.      Relevant Orders   PSA    Other Visit Diagnoses    PE (physical exam), annual       Relevant Orders   CBC with Differential/Platelet   Comprehensive  metabolic panel   Lipid panel   PSA   TSH  Urinalysis, Routine w reflex microscopic       Follow up plan: Return in about 1 year (around 12/01/2017) for Physical Exam.

## 2016-12-02 ENCOUNTER — Encounter: Payer: Self-pay | Admitting: Family Medicine

## 2016-12-02 LAB — CBC WITH DIFFERENTIAL/PLATELET
BASOS: 0 %
Basophils Absolute: 0 10*3/uL (ref 0.0–0.2)
EOS (ABSOLUTE): 0.1 10*3/uL (ref 0.0–0.4)
EOS: 2 %
HEMATOCRIT: 45.8 % (ref 37.5–51.0)
Hemoglobin: 15.1 g/dL (ref 13.0–17.7)
Immature Grans (Abs): 0 10*3/uL (ref 0.0–0.1)
Immature Granulocytes: 0 %
LYMPHS ABS: 2.1 10*3/uL (ref 0.7–3.1)
LYMPHS: 33 %
MCH: 29.6 pg (ref 26.6–33.0)
MCHC: 33 g/dL (ref 31.5–35.7)
MCV: 90 fL (ref 79–97)
MONOCYTES: 5 %
Monocytes Absolute: 0.3 10*3/uL (ref 0.1–0.9)
Neutrophils Absolute: 3.8 10*3/uL (ref 1.4–7.0)
Neutrophils: 60 %
PLATELETS: 270 10*3/uL (ref 150–379)
RBC: 5.1 x10E6/uL (ref 4.14–5.80)
RDW: 13.8 % (ref 12.3–15.4)
WBC: 6.3 10*3/uL (ref 3.4–10.8)

## 2016-12-02 LAB — COMPREHENSIVE METABOLIC PANEL
A/G RATIO: 2.1 (ref 1.2–2.2)
ALK PHOS: 85 IU/L (ref 39–117)
ALT: 16 IU/L (ref 0–44)
AST: 14 IU/L (ref 0–40)
Albumin: 4.5 g/dL (ref 3.5–5.5)
BILIRUBIN TOTAL: 0.4 mg/dL (ref 0.0–1.2)
BUN/Creatinine Ratio: 13 (ref 9–20)
BUN: 15 mg/dL (ref 6–24)
CHLORIDE: 99 mmol/L (ref 96–106)
CO2: 25 mmol/L (ref 20–29)
Calcium: 9.1 mg/dL (ref 8.7–10.2)
Creatinine, Ser: 1.14 mg/dL (ref 0.76–1.27)
GFR calc Af Amer: 84 mL/min/{1.73_m2} (ref >59)
GFR calc non Af Amer: 73 mL/min/{1.73_m2} (ref >59)
Globulin, Total: 2.1 g/dL (ref 1.5–4.5)
Glucose: 107 mg/dL — ABNORMAL HIGH (ref 65–99)
POTASSIUM: 4.5 mmol/L (ref 3.5–5.2)
Sodium: 140 mmol/L (ref 134–144)
Total Protein: 6.6 g/dL (ref 6.0–8.5)

## 2016-12-02 LAB — LIPID PANEL
Chol/HDL Ratio: 4.8 ratio (ref 0.0–5.0)
Cholesterol, Total: 216 mg/dL — ABNORMAL HIGH (ref 100–199)
HDL: 45 mg/dL (ref >39)
LDL Calculated: 148 mg/dL — ABNORMAL HIGH (ref 0–99)
TRIGLYCERIDES: 117 mg/dL (ref 0–149)
VLDL CHOLESTEROL CAL: 23 mg/dL (ref 5–40)

## 2016-12-02 LAB — PSA: Prostate Specific Ag, Serum: 2.2 ng/mL (ref 0.0–4.0)

## 2016-12-02 LAB — TSH: TSH: 2.03 u[IU]/mL (ref 0.450–4.500)

## 2016-12-29 DIAGNOSIS — G4733 Obstructive sleep apnea (adult) (pediatric): Secondary | ICD-10-CM | POA: Diagnosis not present

## 2017-03-15 DIAGNOSIS — M545 Low back pain: Secondary | ICD-10-CM | POA: Diagnosis not present

## 2017-03-15 DIAGNOSIS — M9903 Segmental and somatic dysfunction of lumbar region: Secondary | ICD-10-CM | POA: Diagnosis not present

## 2017-03-15 DIAGNOSIS — M25531 Pain in right wrist: Secondary | ICD-10-CM | POA: Diagnosis not present

## 2017-03-15 DIAGNOSIS — M955 Acquired deformity of pelvis: Secondary | ICD-10-CM | POA: Diagnosis not present

## 2017-03-15 DIAGNOSIS — M25511 Pain in right shoulder: Secondary | ICD-10-CM | POA: Diagnosis not present

## 2017-03-15 DIAGNOSIS — M9905 Segmental and somatic dysfunction of pelvic region: Secondary | ICD-10-CM | POA: Diagnosis not present

## 2017-03-21 DIAGNOSIS — M955 Acquired deformity of pelvis: Secondary | ICD-10-CM | POA: Diagnosis not present

## 2017-03-21 DIAGNOSIS — M25511 Pain in right shoulder: Secondary | ICD-10-CM | POA: Diagnosis not present

## 2017-03-21 DIAGNOSIS — M25531 Pain in right wrist: Secondary | ICD-10-CM | POA: Diagnosis not present

## 2017-03-21 DIAGNOSIS — M545 Low back pain: Secondary | ICD-10-CM | POA: Diagnosis not present

## 2017-03-21 DIAGNOSIS — M9905 Segmental and somatic dysfunction of pelvic region: Secondary | ICD-10-CM | POA: Diagnosis not present

## 2017-03-21 DIAGNOSIS — M9903 Segmental and somatic dysfunction of lumbar region: Secondary | ICD-10-CM | POA: Diagnosis not present

## 2017-03-23 DIAGNOSIS — M955 Acquired deformity of pelvis: Secondary | ICD-10-CM | POA: Diagnosis not present

## 2017-03-23 DIAGNOSIS — M25531 Pain in right wrist: Secondary | ICD-10-CM | POA: Diagnosis not present

## 2017-03-23 DIAGNOSIS — M25511 Pain in right shoulder: Secondary | ICD-10-CM | POA: Diagnosis not present

## 2017-03-23 DIAGNOSIS — M545 Low back pain: Secondary | ICD-10-CM | POA: Diagnosis not present

## 2017-03-23 DIAGNOSIS — M9903 Segmental and somatic dysfunction of lumbar region: Secondary | ICD-10-CM | POA: Diagnosis not present

## 2017-03-23 DIAGNOSIS — M9905 Segmental and somatic dysfunction of pelvic region: Secondary | ICD-10-CM | POA: Diagnosis not present

## 2017-03-24 DIAGNOSIS — M545 Low back pain: Secondary | ICD-10-CM | POA: Diagnosis not present

## 2017-03-24 DIAGNOSIS — M25531 Pain in right wrist: Secondary | ICD-10-CM | POA: Diagnosis not present

## 2017-03-24 DIAGNOSIS — M25511 Pain in right shoulder: Secondary | ICD-10-CM | POA: Diagnosis not present

## 2017-03-24 DIAGNOSIS — M9905 Segmental and somatic dysfunction of pelvic region: Secondary | ICD-10-CM | POA: Diagnosis not present

## 2017-03-24 DIAGNOSIS — M9903 Segmental and somatic dysfunction of lumbar region: Secondary | ICD-10-CM | POA: Diagnosis not present

## 2017-03-24 DIAGNOSIS — M955 Acquired deformity of pelvis: Secondary | ICD-10-CM | POA: Diagnosis not present

## 2017-03-29 DIAGNOSIS — M25531 Pain in right wrist: Secondary | ICD-10-CM | POA: Diagnosis not present

## 2017-03-29 DIAGNOSIS — M955 Acquired deformity of pelvis: Secondary | ICD-10-CM | POA: Diagnosis not present

## 2017-03-29 DIAGNOSIS — M25511 Pain in right shoulder: Secondary | ICD-10-CM | POA: Diagnosis not present

## 2017-03-29 DIAGNOSIS — M9903 Segmental and somatic dysfunction of lumbar region: Secondary | ICD-10-CM | POA: Diagnosis not present

## 2017-03-29 DIAGNOSIS — M545 Low back pain: Secondary | ICD-10-CM | POA: Diagnosis not present

## 2017-03-29 DIAGNOSIS — M9905 Segmental and somatic dysfunction of pelvic region: Secondary | ICD-10-CM | POA: Diagnosis not present

## 2017-03-31 DIAGNOSIS — M545 Low back pain: Secondary | ICD-10-CM | POA: Diagnosis not present

## 2017-03-31 DIAGNOSIS — M25511 Pain in right shoulder: Secondary | ICD-10-CM | POA: Diagnosis not present

## 2017-03-31 DIAGNOSIS — M9905 Segmental and somatic dysfunction of pelvic region: Secondary | ICD-10-CM | POA: Diagnosis not present

## 2017-03-31 DIAGNOSIS — M9903 Segmental and somatic dysfunction of lumbar region: Secondary | ICD-10-CM | POA: Diagnosis not present

## 2017-03-31 DIAGNOSIS — M25531 Pain in right wrist: Secondary | ICD-10-CM | POA: Diagnosis not present

## 2017-03-31 DIAGNOSIS — M955 Acquired deformity of pelvis: Secondary | ICD-10-CM | POA: Diagnosis not present

## 2017-04-04 DIAGNOSIS — G4733 Obstructive sleep apnea (adult) (pediatric): Secondary | ICD-10-CM | POA: Diagnosis not present

## 2017-04-07 DIAGNOSIS — M545 Low back pain: Secondary | ICD-10-CM | POA: Diagnosis not present

## 2017-04-07 DIAGNOSIS — M25511 Pain in right shoulder: Secondary | ICD-10-CM | POA: Diagnosis not present

## 2017-04-07 DIAGNOSIS — M9903 Segmental and somatic dysfunction of lumbar region: Secondary | ICD-10-CM | POA: Diagnosis not present

## 2017-04-07 DIAGNOSIS — M9905 Segmental and somatic dysfunction of pelvic region: Secondary | ICD-10-CM | POA: Diagnosis not present

## 2017-04-07 DIAGNOSIS — M955 Acquired deformity of pelvis: Secondary | ICD-10-CM | POA: Diagnosis not present

## 2017-04-07 DIAGNOSIS — M25531 Pain in right wrist: Secondary | ICD-10-CM | POA: Diagnosis not present

## 2017-04-12 DIAGNOSIS — M545 Low back pain: Secondary | ICD-10-CM | POA: Diagnosis not present

## 2017-04-12 DIAGNOSIS — M9905 Segmental and somatic dysfunction of pelvic region: Secondary | ICD-10-CM | POA: Diagnosis not present

## 2017-04-12 DIAGNOSIS — M25531 Pain in right wrist: Secondary | ICD-10-CM | POA: Diagnosis not present

## 2017-04-12 DIAGNOSIS — M9903 Segmental and somatic dysfunction of lumbar region: Secondary | ICD-10-CM | POA: Diagnosis not present

## 2017-04-12 DIAGNOSIS — M955 Acquired deformity of pelvis: Secondary | ICD-10-CM | POA: Diagnosis not present

## 2017-04-12 DIAGNOSIS — M25511 Pain in right shoulder: Secondary | ICD-10-CM | POA: Diagnosis not present

## 2017-04-19 ENCOUNTER — Encounter: Payer: Self-pay | Admitting: *Deleted

## 2017-04-19 LAB — HEPATIC FUNCTION PANEL
ALT: 17 (ref 10–40)
AST: 10 — AB (ref 14–40)
Alkaline Phosphatase: 79 (ref 25–125)
BILIRUBIN, TOTAL: 0.4

## 2017-04-19 LAB — VITAMIN D 25 HYDROXY (VIT D DEFICIENCY, FRACTURES): VIT D 25 HYDROXY: 22.4

## 2017-04-19 LAB — BASIC METABOLIC PANEL
BUN: 20 (ref 4–21)
CREATININE: 1.1 (ref 0.6–1.3)
Glucose: 112
POTASSIUM: 4.5 (ref 3.4–5.3)
Sodium: 139 (ref 137–147)

## 2017-04-19 LAB — LIPID PANEL
CHOLESTEROL: 6 (ref 0–200)
HDL: 39 (ref 35–70)
LDL Cholesterol: 150
Triglycerides: 159 (ref 40–160)

## 2017-04-19 LAB — HEMOGLOBIN A1C: HEMOGLOBIN A1C: 5.8

## 2017-04-19 LAB — CBC AND DIFFERENTIAL: WBC: 6.5

## 2017-04-19 LAB — TSH: TSH: 1.55 (ref 0.41–5.90)

## 2017-04-21 DIAGNOSIS — M545 Low back pain: Secondary | ICD-10-CM | POA: Diagnosis not present

## 2017-04-21 DIAGNOSIS — M9905 Segmental and somatic dysfunction of pelvic region: Secondary | ICD-10-CM | POA: Diagnosis not present

## 2017-04-21 DIAGNOSIS — M9903 Segmental and somatic dysfunction of lumbar region: Secondary | ICD-10-CM | POA: Diagnosis not present

## 2017-04-21 DIAGNOSIS — M25531 Pain in right wrist: Secondary | ICD-10-CM | POA: Diagnosis not present

## 2017-04-21 DIAGNOSIS — M25511 Pain in right shoulder: Secondary | ICD-10-CM | POA: Diagnosis not present

## 2017-04-21 DIAGNOSIS — M955 Acquired deformity of pelvis: Secondary | ICD-10-CM | POA: Diagnosis not present

## 2017-04-27 ENCOUNTER — Telehealth: Payer: Self-pay | Admitting: Family Medicine

## 2017-04-27 NOTE — Telephone Encounter (Signed)
Phone call Discussed blood work done at [facility] which was normal except for low vitamin D discussed vitamin D replacement with over-the-counter medications.

## 2017-04-29 DIAGNOSIS — N401 Enlarged prostate with lower urinary tract symptoms: Secondary | ICD-10-CM | POA: Diagnosis not present

## 2017-04-29 DIAGNOSIS — E291 Testicular hypofunction: Secondary | ICD-10-CM | POA: Diagnosis not present

## 2017-04-29 DIAGNOSIS — N5201 Erectile dysfunction due to arterial insufficiency: Secondary | ICD-10-CM | POA: Diagnosis not present

## 2017-05-12 DIAGNOSIS — M545 Low back pain: Secondary | ICD-10-CM | POA: Diagnosis not present

## 2017-05-12 DIAGNOSIS — M9905 Segmental and somatic dysfunction of pelvic region: Secondary | ICD-10-CM | POA: Diagnosis not present

## 2017-05-12 DIAGNOSIS — M955 Acquired deformity of pelvis: Secondary | ICD-10-CM | POA: Diagnosis not present

## 2017-05-12 DIAGNOSIS — M9903 Segmental and somatic dysfunction of lumbar region: Secondary | ICD-10-CM | POA: Diagnosis not present

## 2017-06-06 ENCOUNTER — Encounter: Payer: Self-pay | Admitting: Family Medicine

## 2017-06-09 DIAGNOSIS — M9903 Segmental and somatic dysfunction of lumbar region: Secondary | ICD-10-CM | POA: Diagnosis not present

## 2017-06-09 DIAGNOSIS — M545 Low back pain: Secondary | ICD-10-CM | POA: Diagnosis not present

## 2017-06-09 DIAGNOSIS — M955 Acquired deformity of pelvis: Secondary | ICD-10-CM | POA: Diagnosis not present

## 2017-06-09 DIAGNOSIS — M9905 Segmental and somatic dysfunction of pelvic region: Secondary | ICD-10-CM | POA: Diagnosis not present

## 2017-07-05 DIAGNOSIS — G4733 Obstructive sleep apnea (adult) (pediatric): Secondary | ICD-10-CM | POA: Diagnosis not present

## 2017-07-14 DIAGNOSIS — M545 Low back pain: Secondary | ICD-10-CM | POA: Diagnosis not present

## 2017-07-14 DIAGNOSIS — M9903 Segmental and somatic dysfunction of lumbar region: Secondary | ICD-10-CM | POA: Diagnosis not present

## 2017-07-14 DIAGNOSIS — M955 Acquired deformity of pelvis: Secondary | ICD-10-CM | POA: Diagnosis not present

## 2017-07-14 DIAGNOSIS — M9905 Segmental and somatic dysfunction of pelvic region: Secondary | ICD-10-CM | POA: Diagnosis not present

## 2017-08-25 DIAGNOSIS — M9903 Segmental and somatic dysfunction of lumbar region: Secondary | ICD-10-CM | POA: Diagnosis not present

## 2017-08-25 DIAGNOSIS — M9905 Segmental and somatic dysfunction of pelvic region: Secondary | ICD-10-CM | POA: Diagnosis not present

## 2017-08-25 DIAGNOSIS — M545 Low back pain: Secondary | ICD-10-CM | POA: Diagnosis not present

## 2017-08-25 DIAGNOSIS — M955 Acquired deformity of pelvis: Secondary | ICD-10-CM | POA: Diagnosis not present

## 2017-10-10 DIAGNOSIS — G4733 Obstructive sleep apnea (adult) (pediatric): Secondary | ICD-10-CM | POA: Diagnosis not present

## 2017-10-28 DIAGNOSIS — N5201 Erectile dysfunction due to arterial insufficiency: Secondary | ICD-10-CM | POA: Diagnosis not present

## 2017-10-28 DIAGNOSIS — E291 Testicular hypofunction: Secondary | ICD-10-CM | POA: Diagnosis not present

## 2017-10-28 DIAGNOSIS — Z79899 Other long term (current) drug therapy: Secondary | ICD-10-CM | POA: Diagnosis not present

## 2017-10-28 DIAGNOSIS — N401 Enlarged prostate with lower urinary tract symptoms: Secondary | ICD-10-CM | POA: Diagnosis not present

## 2017-11-09 DIAGNOSIS — Z79899 Other long term (current) drug therapy: Secondary | ICD-10-CM | POA: Diagnosis not present

## 2017-11-09 DIAGNOSIS — E291 Testicular hypofunction: Secondary | ICD-10-CM | POA: Diagnosis not present

## 2017-11-09 DIAGNOSIS — N5201 Erectile dysfunction due to arterial insufficiency: Secondary | ICD-10-CM | POA: Diagnosis not present

## 2017-11-09 DIAGNOSIS — N401 Enlarged prostate with lower urinary tract symptoms: Secondary | ICD-10-CM | POA: Diagnosis not present

## 2017-12-05 ENCOUNTER — Encounter: Payer: BLUE CROSS/BLUE SHIELD | Admitting: Family Medicine

## 2017-12-12 DIAGNOSIS — D2261 Melanocytic nevi of right upper limb, including shoulder: Secondary | ICD-10-CM | POA: Diagnosis not present

## 2017-12-12 DIAGNOSIS — X32XXXA Exposure to sunlight, initial encounter: Secondary | ICD-10-CM | POA: Diagnosis not present

## 2017-12-12 DIAGNOSIS — L570 Actinic keratosis: Secondary | ICD-10-CM | POA: Diagnosis not present

## 2017-12-12 DIAGNOSIS — L400 Psoriasis vulgaris: Secondary | ICD-10-CM | POA: Diagnosis not present

## 2017-12-12 DIAGNOSIS — D225 Melanocytic nevi of trunk: Secondary | ICD-10-CM | POA: Diagnosis not present

## 2017-12-12 DIAGNOSIS — D485 Neoplasm of uncertain behavior of skin: Secondary | ICD-10-CM | POA: Diagnosis not present

## 2017-12-12 DIAGNOSIS — D2262 Melanocytic nevi of left upper limb, including shoulder: Secondary | ICD-10-CM | POA: Diagnosis not present

## 2017-12-26 ENCOUNTER — Encounter: Payer: Self-pay | Admitting: Family Medicine

## 2017-12-26 ENCOUNTER — Ambulatory Visit (INDEPENDENT_AMBULATORY_CARE_PROVIDER_SITE_OTHER): Payer: BLUE CROSS/BLUE SHIELD | Admitting: Family Medicine

## 2017-12-26 VITALS — BP 106/62 | HR 65 | Temp 98.3°F | Ht 70.25 in | Wt 241.4 lb

## 2017-12-26 DIAGNOSIS — E291 Testicular hypofunction: Secondary | ICD-10-CM

## 2017-12-26 DIAGNOSIS — Z0000 Encounter for general adult medical examination without abnormal findings: Secondary | ICD-10-CM

## 2017-12-26 DIAGNOSIS — Z9989 Dependence on other enabling machines and devices: Secondary | ICD-10-CM | POA: Diagnosis not present

## 2017-12-26 DIAGNOSIS — I10 Essential (primary) hypertension: Secondary | ICD-10-CM | POA: Diagnosis not present

## 2017-12-26 DIAGNOSIS — G4733 Obstructive sleep apnea (adult) (pediatric): Secondary | ICD-10-CM | POA: Diagnosis not present

## 2017-12-26 LAB — URINALYSIS, ROUTINE W REFLEX MICROSCOPIC
BILIRUBIN UA: NEGATIVE
GLUCOSE, UA: NEGATIVE
Ketones, UA: NEGATIVE
LEUKOCYTES UA: NEGATIVE
Nitrite, UA: NEGATIVE
PROTEIN UA: NEGATIVE
RBC UA: NEGATIVE
SPEC GRAV UA: 1.01 (ref 1.005–1.030)
Urobilinogen, Ur: 0.2 mg/dL (ref 0.2–1.0)
pH, UA: 6.5 (ref 5.0–7.5)

## 2017-12-26 MED ORDER — BENAZEPRIL HCL 40 MG PO TABS: 40.0000 mg | ORAL_TABLET | Freq: Every day | ORAL | 4 refills | Status: DC

## 2017-12-26 NOTE — Assessment & Plan Note (Signed)
Good response no daytime drowsiness

## 2017-12-26 NOTE — Assessment & Plan Note (Signed)
The current medical regimen is effective;  continue present plan and medications.  

## 2017-12-26 NOTE — Progress Notes (Signed)
BP 106/62 (BP Location: Left Arm, Patient Position: Sitting, Cuff Size: Normal)   Pulse 65   Temp 98.3 F (36.8 C) (Oral)   Ht 5' 10.25" (1.784 m)   Wt 241 lb 6.4 oz (109.5 kg)   SpO2 96%   BMI 34.39 kg/m    Subjective:    Patient ID: Larry Cochran, male    DOB: [birth year], 57 y.o.   MRN: [id number]  HPI: Larry Cochran is a 57 y.o. male  Chief Complaint  Patient presents with  . Annual Exam  Patient all in all doing well with his CPAP working well faithful use and good results with resolution of daytime drowsiness. Patient doing well on testosterone saw urology last month with normal exam and prescription for testosterone replaced. Blood pressure doing well with no complaints from medications. Psoriasis stable has Talclonex scalp shampoo which does well. Reviewed previous colonoscopy done November 2015 with polyps found recommendation for repeat in 5 years.  Relevant past medical, surgical, family and social history reviewed and updated as indicated. Interim medical history since our last visit reviewed. Allergies and medications reviewed and updated.  Review of Systems  Constitutional: Negative.   HENT: Negative.   Eyes: Negative.   Respiratory: Negative.   Cardiovascular: Negative.   Gastrointestinal: Negative.   Endocrine: Negative.   Genitourinary: Negative.   Musculoskeletal: Negative.   Skin: Negative.   Allergic/Immunologic: Negative.   Neurological: Negative.   Hematological: Negative.   Psychiatric/Behavioral: Negative.     Per HPI unless specifically indicated above     Objective:    BP 106/62 (BP Location: Left Arm, Patient Position: Sitting, Cuff Size: Normal)   Pulse 65   Temp 98.3 F (36.8 C) (Oral)   Ht 5' 10.25" (1.784 m)   Wt 241 lb 6.4 oz (109.5 kg)   SpO2 96%   BMI 34.39 kg/m   Wt Readings from Last 3 Encounters:  12/26/17 241 lb 6.4 oz (109.5 kg)  12/01/16 225 lb (102.1 kg)  05/24/16 236 lb (107 kg)    Physical Exam    Constitutional: He is oriented to person, place, and time. He appears well-developed and well-nourished.  HENT:  Head: Normocephalic.  Right Ear: External ear normal.  Left Ear: External ear normal.  Nose: Nose normal.  Eyes: Pupils are equal, round, and reactive to light. Conjunctivae and EOM are normal.  Neck: Normal range of motion. Neck supple. No thyromegaly present.  Cardiovascular: Normal rate, regular rhythm, normal heart sounds and intact distal pulses.  Pulmonary/Chest: Effort normal and breath sounds normal.  Abdominal: Soft. Bowel sounds are normal. There is no splenomegaly or hepatomegaly.  Genitourinary:  Genitourinary Comments: Done at Urology  Musculoskeletal: Normal range of motion.  Lymphadenopathy:    He has no cervical adenopathy.  Neurological: He is alert and oriented to person, place, and time. He has normal reflexes.  Skin: Skin is warm and dry.  Psychiatric: He has a normal mood and affect. His behavior is normal. Judgment and thought content normal.    Results for orders placed or performed in visit on 04/28/17  VITAMIN D 25 Hydroxy (Vit-D Deficiency, Fractures)  Result Value Ref Range   Vit D, 25-Hydroxy 22.4   Basic metabolic panel  Result Value Ref Range   Glucose 112    BUN 20 4 - 21   Creatinine 1.1 0.6 - 1.3   Potassium 4.5 3.4 - 5.3   Sodium 139 137 - 147  Lipid panel  Result Value Ref Range  Triglycerides 159 40 - 160   Cholesterol 6 0 - 200   HDL 39 35 - 70   LDL Cholesterol 150   Hepatic function panel  Result Value Ref Range   Alkaline Phosphatase 79 25 - 125   ALT 17 10 - 40   AST 10 (A) 14 - 40   Bilirubin, Total 0.4   Hemoglobin A1c  Result Value Ref Range   Hemoglobin A1C 5.8   TSH  Result Value Ref Range   TSH 1.55 0.41 - 5.90  CBC and differential  Result Value Ref Range   WBC 6.5       Assessment & Plan:   Problem List Items Addressed This Visit      Cardiovascular and Mediastinum   Hypertension    The current  medical regimen is effective;  continue present plan and medications.       Relevant Medications   benazepril (LOTENSIN) 40 MG tablet     Respiratory   OSA on CPAP    Good response no daytime drowsiness        Endocrine   Hypogonadism in male    The current medical regimen is effective;  continue present plan and medications.        Other Visit Diagnoses    PE (physical exam), annual    -  Primary   Relevant Orders   Comprehensive metabolic panel   Lipid panel   CBC with Differential/Platelet   TSH   Urinalysis, Routine w reflex microscopic       Follow up plan: Return in about 1 year (around 12/27/2018) for Physical Exam.

## 2017-12-26 NOTE — Progress Notes (Signed)
Depression screen [facility] 2/9 12/26/2017 12/01/2016 11/11/2015  Decreased Interest 0 0 0  Down, Depressed, Hopeless 0 0 0  PHQ - 2 Score 0 0 0  Altered sleeping 0 - -  Tired, decreased energy 0 - -  Change in appetite 0 - -  Feeling bad or failure about yourself  0 - -  Trouble concentrating 0 - -  Moving slowly or fidgety/restless 0 - -  Suicidal thoughts 0 - -  PHQ-9 Score 0 - -

## 2017-12-27 LAB — COMPREHENSIVE METABOLIC PANEL
ALBUMIN: 4.4 g/dL (ref 3.5–5.5)
ALK PHOS: 77 IU/L (ref 39–117)
ALT: 24 IU/L (ref 0–44)
AST: 13 IU/L (ref 0–40)
Albumin/Globulin Ratio: 2.3 — ABNORMAL HIGH (ref 1.2–2.2)
BILIRUBIN TOTAL: 0.3 mg/dL (ref 0.0–1.2)
BUN / CREAT RATIO: 13 (ref 9–20)
BUN: 14 mg/dL (ref 6–24)
CHLORIDE: 101 mmol/L (ref 96–106)
CO2: 24 mmol/L (ref 20–29)
CREATININE: 1.09 mg/dL (ref 0.76–1.27)
Calcium: 8.7 mg/dL (ref 8.7–10.2)
GFR calc Af Amer: 88 mL/min/{1.73_m2} (ref >59)
GFR calc non Af Amer: 76 mL/min/{1.73_m2} (ref >59)
GLOBULIN, TOTAL: 1.9 g/dL (ref 1.5–4.5)
Glucose: 82 mg/dL (ref 65–99)
Potassium: 4.1 mmol/L (ref 3.5–5.2)
SODIUM: 142 mmol/L (ref 134–144)
Total Protein: 6.3 g/dL (ref 6.0–8.5)

## 2017-12-27 LAB — CBC WITH DIFFERENTIAL/PLATELET
BASOS: 1 %
Basophils Absolute: 0.1 10*3/uL (ref 0.0–0.2)
EOS (ABSOLUTE): 0.1 10*3/uL (ref 0.0–0.4)
Eos: 1 %
Hematocrit: 43.4 % (ref 37.5–51.0)
Hemoglobin: 14.6 g/dL (ref 13.0–17.7)
IMMATURE GRANS (ABS): 0 10*3/uL (ref 0.0–0.1)
IMMATURE GRANULOCYTES: 0 %
LYMPHS: 34 %
Lymphocytes Absolute: 2.4 10*3/uL (ref 0.7–3.1)
MCH: 28.9 pg (ref 26.6–33.0)
MCHC: 33.6 g/dL (ref 31.5–35.7)
MCV: 86 fL (ref 79–97)
Monocytes Absolute: 0.4 10*3/uL (ref 0.1–0.9)
Monocytes: 6 %
NEUTROS PCT: 58 %
Neutrophils Absolute: 4 10*3/uL (ref 1.4–7.0)
PLATELETS: 261 10*3/uL (ref 150–450)
RBC: 5.05 x10E6/uL (ref 4.14–5.80)
RDW: 13 % (ref 12.3–15.4)
WBC: 6.9 10*3/uL (ref 3.4–10.8)

## 2017-12-27 LAB — LIPID PANEL
CHOLESTEROL TOTAL: 215 mg/dL — AB (ref 100–199)
Chol/HDL Ratio: 5.5 ratio — ABNORMAL HIGH (ref 0.0–5.0)
HDL: 39 mg/dL — ABNORMAL LOW (ref >39)
LDL CALC: 129 mg/dL — AB (ref 0–99)
Triglycerides: 236 mg/dL — ABNORMAL HIGH (ref 0–149)
VLDL Cholesterol Cal: 47 mg/dL — ABNORMAL HIGH (ref 5–40)

## 2017-12-27 LAB — TSH: TSH: 1.79 u[IU]/mL (ref 0.450–4.500)

## 2017-12-28 ENCOUNTER — Encounter: Payer: Self-pay | Admitting: Family Medicine

## 2018-01-09 DIAGNOSIS — G4733 Obstructive sleep apnea (adult) (pediatric): Secondary | ICD-10-CM | POA: Diagnosis not present

## 2018-04-10 DIAGNOSIS — G4733 Obstructive sleep apnea (adult) (pediatric): Secondary | ICD-10-CM | POA: Diagnosis not present

## 2018-04-12 DIAGNOSIS — G4733 Obstructive sleep apnea (adult) (pediatric): Secondary | ICD-10-CM | POA: Diagnosis not present

## 2018-04-28 DIAGNOSIS — Z79899 Other long term (current) drug therapy: Secondary | ICD-10-CM | POA: Diagnosis not present

## 2018-04-28 DIAGNOSIS — N401 Enlarged prostate with lower urinary tract symptoms: Secondary | ICD-10-CM | POA: Diagnosis not present

## 2018-04-28 DIAGNOSIS — E291 Testicular hypofunction: Secondary | ICD-10-CM | POA: Diagnosis not present

## 2018-04-28 DIAGNOSIS — N5201 Erectile dysfunction due to arterial insufficiency: Secondary | ICD-10-CM | POA: Diagnosis not present

## 2018-07-13 DIAGNOSIS — G4733 Obstructive sleep apnea (adult) (pediatric): Secondary | ICD-10-CM | POA: Diagnosis not present

## 2018-07-19 ENCOUNTER — Encounter: Payer: Self-pay | Admitting: Family Medicine

## 2018-07-19 ENCOUNTER — Ambulatory Visit (INDEPENDENT_AMBULATORY_CARE_PROVIDER_SITE_OTHER): Payer: BLUE CROSS/BLUE SHIELD | Admitting: Family Medicine

## 2018-07-19 ENCOUNTER — Other Ambulatory Visit: Payer: Self-pay

## 2018-07-19 DIAGNOSIS — I10 Essential (primary) hypertension: Secondary | ICD-10-CM

## 2018-07-19 DIAGNOSIS — E291 Testicular hypofunction: Secondary | ICD-10-CM | POA: Diagnosis not present

## 2018-07-19 DIAGNOSIS — Z9989 Dependence on other enabling machines and devices: Secondary | ICD-10-CM | POA: Diagnosis not present

## 2018-07-19 DIAGNOSIS — G4733 Obstructive sleep apnea (adult) (pediatric): Secondary | ICD-10-CM

## 2018-07-19 NOTE — Progress Notes (Signed)
BP 129/77    Subjective:    Patient ID: Larry Cochran, male    DOB: 05/12/1960, 58 y.o.   MRN: [id number]  HPI: Larry GlowFrank Salmons is a 58 y.o. male  Medical concerns  Telemedicine using audio/video telecommunications for a synchronous communication visit. Today's visit due to COVID-19 isolation precautions I connected with and verified that I am speaking with the correct person using two identifiers.   I discussed the limitations, risks, security and privacy concerns of performing an evaluation and management service by telecommunication and the availability of in person appointments. I also discussed with the patient that there may be a patient responsible charge related to this service. The patient expressed understanding and agreed to proceed. The patient's location is home. I am at home.  Patient concerned about nonspecific symptoms of feeling slightly bad had some burning type sensations of discomfort and headache.  No real URI symptoms but concerned about prodrome of COVID-19. Has been checking his blood pressure and that is been good. Taking testosterone without problems. Using his CPAP without problems and good control with resolution of his daytime drowsiness. Reviewed patient with no fever chills cough or other URI type symptoms.  Relevant past medical, surgical, family and social history reviewed and updated as indicated. Interim medical history since our last visit reviewed. Allergies and medications reviewed and updated.  Review of Systems  Constitutional: Negative.   Respiratory: Negative.   Cardiovascular: Negative.     Per HPI unless specifically indicated above     Objective:    BP 129/77   Wt Readings from Last 3 Encounters:  12/26/17 241 lb 6.4 oz (109.5 kg)  12/01/16 225 lb (102.1 kg)  05/24/16 236 lb (107 kg)    Physical Exam  Results for orders placed or performed in visit on 12/26/17  Comprehensive metabolic panel  Result Value Ref Range   Glucose 82 65 - 99 mg/dL   BUN 14 6 - 24 mg/dL   Creatinine, Ser 9.521.09 0.76 - 1.27 mg/dL   GFR calc non Af Amer 76 >59 mL/min/1.73   GFR calc Af Amer 88 >59 mL/min/1.73   BUN/Creatinine Ratio 13 9 - 20   Sodium 142 134 - 144 mmol/L   Potassium 4.1 3.5 - 5.2 mmol/L   Chloride 101 96 - 106 mmol/L   CO2 24 20 - 29 mmol/L   Calcium 8.7 8.7 - 10.2 mg/dL   Total Protein 6.3 6.0 - 8.5 g/dL   Albumin 4.4 3.5 - 5.5 g/dL   Globulin, Total 1.9 1.5 - 4.5 g/dL   Albumin/Globulin Ratio 2.3 (H) 1.2 - 2.2   Bilirubin Total 0.3 0.0 - 1.2 mg/dL   Alkaline Phosphatase 77 39 - 117 IU/L   AST 13 0 - 40 IU/L   ALT 24 0 - 44 IU/L  Lipid panel  Result Value Ref Range   Cholesterol, Total 215 (H) 100 - 199 mg/dL   Triglycerides 841236 (H) 0 - 149 mg/dL   HDL 39 (L) >32>39 mg/dL   VLDL Cholesterol Cal 47 (H) 5 - 40 mg/dL   LDL Calculated 440129 (H) 0 - 99 mg/dL   Chol/HDL Ratio 5.5 (H) 0.0 - 5.0 ratio  CBC with Differential/Platelet  Result Value Ref Range   WBC 6.9 3.4 - 10.8 x10E3/uL   RBC 5.05 4.14 - 5.80 x10E6/uL   Hemoglobin 14.6 13.0 - 17.7 g/dL   Hematocrit 10.243.4 72.537.5 - 51.0 %   MCV 86 79 - 97 fL   MCH 28.9 26.6 -  33.0 pg   MCHC 33.6 31.5 - 35.7 g/dL   RDW 50.3 54.6 - 56.8 %   Platelets 261 150 - 450 x10E3/uL   Neutrophils 58 Not Estab. %   Lymphs 34 Not Estab. %   Monocytes 6 Not Estab. %   Eos 1 Not Estab. %   Basos 1 Not Estab. %   Neutrophils Absolute 4.0 1.4 - 7.0 x10E3/uL   Lymphocytes Absolute 2.4 0.7 - 3.1 x10E3/uL   Monocytes Absolute 0.4 0.1 - 0.9 x10E3/uL   EOS (ABSOLUTE) 0.1 0.0 - 0.4 x10E3/uL   Basophils Absolute 0.1 0.0 - 0.2 x10E3/uL   Immature Granulocytes 0 Not Estab. %   Immature Grans (Abs) 0.0 0.0 - 0.1 x10E3/uL  TSH  Result Value Ref Range   TSH 1.790 0.450 - 4.500 uIU/mL  Urinalysis, Routine w reflex microscopic  Result Value Ref Range   Specific Gravity, UA 1.010 1.005 - 1.030   pH, UA 6.5 5.0 - 7.5   Color, UA Yellow Yellow   Appearance Ur Clear Clear    Leukocytes, UA Negative Negative   Protein, UA Negative Negative/Trace   Glucose, UA Negative Negative   Ketones, UA Negative Negative   RBC, UA Negative Negative   Bilirubin, UA Negative Negative   Urobilinogen, Ur 0.2 0.2 - 1.0 mg/dL   Nitrite, UA Negative Negative      Assessment & Plan:   Problem List Items Addressed This Visit      Cardiovascular and Mediastinum   Hypertension    The current medical regimen is effective;  continue present plan and medications.         Respiratory   OSA on CPAP    The current medical regimen is effective;  continue present plan and medications.         Endocrine   Hypogonadism in male    The current medical regimen is effective;  continue present plan and medications.        Nonspecific URI type prodromal symptoms. Discussed will observe discussed COVID-19 testing and health behaviors. Discussed risk patient at low risk is not going out or working outside of the home.  I discussed the assessment and treatment plan with the patient. The patient was provided an opportunity to ask questions and all were answered. The patient agreed with the plan and demonstrated an understanding of the instructions.   The patient was advised to call back or seek an in-person evaluation if the symptoms worsen or if the condition fails to improve as anticipated.   I provided 21+ minutes of time during this encounter. Follow up plan: Return in about 6 months (around 01/19/2019) for Physical Exam.

## 2018-07-19 NOTE — Assessment & Plan Note (Signed)
The current medical regimen is effective;  continue present plan and medications.  

## 2018-08-23 ENCOUNTER — Ambulatory Visit: Payer: Self-pay | Admitting: Family Medicine

## 2018-08-23 NOTE — Telephone Encounter (Signed)
Pt. Reports he has some COVID 19 exposure. His daughter's friend has been visiting their home and several members of her family have recently tested positive for COVID 25. Pt. Is requesting to be tested. Please advise.  Answer Assessment - Initial Assessment Questions 1. CLOSE CONTACT: "Who is the person with the confirmed or suspected COVID-19 infection that you were exposed to?"     No test 2. PLACE of CONTACT: "Where were you when you were exposed to COVID-19?" (e.g., home, school, medical waiting room; which city?)     Home 3. TYPE of CONTACT: "How much contact was there?" (e.g., sitting next to, live in same house, work in same office, same building)     Person was visiting at his home 4. DURATION of CONTACT: "How long were you in contact with the COVID-19 patient?" (e.g., a few seconds, passed by person, a few minutes, live with the patient)     Daughter's friend visiting at his home 5. DATE of CONTACT: "When did you have contact with a COVID-19 patient?" (e.g., how many days ago)     Last week 6. TRAVEL: "Have you traveled out of the country recently?" If so, "When and where?"     * Also ask about out-of-state travel, since the CDC has identified some high-risk cities for community spread in the [place].     * Note: Travel becomes less relevant if there is widespread community transmission where the patient lives.     No 7. COMMUNITY SPREAD: "Are there lots of cases of COVID-19 (community spread) where you live?" (See public health department website, if unsure)       Yes 8. SYMPTOMS: "Do you have any symptoms?" (e.g., fever, cough, breathing difficulty)     Sinus symptoms,chills, nausea 9. PREGNANCY OR POSTPARTUM: "Is there any chance you are pregnant?" "When was your last menstrual period?" "Did you deliver in the last 2 weeks?"     n/a 10. HIGH RISK: "Do you have any heart or lung problems? Do you have a weak immune system?" (e.g., CHF, COPD, asthma, HIV positive, chemotherapy, renal  failure, diabetes mellitus, sickle cell anemia)       No  Protocols used: CORONAVIRUS (COVID-19) EXPOSURE-A-AH

## 2018-08-24 ENCOUNTER — Other Ambulatory Visit: Payer: BLUE CROSS/BLUE SHIELD

## 2018-08-24 ENCOUNTER — Telehealth: Payer: Self-pay | Admitting: General Practice

## 2018-08-24 ENCOUNTER — Encounter: Payer: Self-pay | Admitting: Nurse Practitioner

## 2018-08-24 ENCOUNTER — Ambulatory Visit (INDEPENDENT_AMBULATORY_CARE_PROVIDER_SITE_OTHER): Payer: BC Managed Care – PPO | Admitting: Nurse Practitioner

## 2018-08-24 ENCOUNTER — Other Ambulatory Visit: Payer: Self-pay

## 2018-08-24 DIAGNOSIS — Z20828 Contact with and (suspected) exposure to other viral communicable diseases: Secondary | ICD-10-CM | POA: Diagnosis not present

## 2018-08-24 DIAGNOSIS — Z20822 Contact with and (suspected) exposure to covid-19: Secondary | ICD-10-CM

## 2018-08-24 NOTE — Telephone Encounter (Signed)
-----   Message from Venita Lick, NP sent at 08/24/2018  9:10 AM EDT ----- Name: Larry Cochran  DOB: 1961-01-04  MRN: [id number]  Insurance: [employer]  Reason: Patient with close exposure to person who is now diagnosed with Covid 19 and on quarantine.  Patient with mild symptoms (congestion, headache, ear pressure).  Risk factors include overweight and OSA.  Would benefit from testing.  Thanks.

## 2018-08-24 NOTE — Telephone Encounter (Signed)
Pt has been scheduled for covid testing. Scheduled with pt directly. Pt was referred by: Venita Lick, NP

## 2018-08-24 NOTE — Addendum Note (Signed)
Addended by: Denman George on: 08/24/2018 09:58 AM   Modules accepted: Orders

## 2018-08-24 NOTE — Patient Instructions (Signed)
This information is directly available on the CDC website: https://www.cdc.gov/coronavirus/2019-ncov/if-you-are-sick/steps-when-sick.html    Source:CDC Reference to specific commercial products, manufacturers, companies, or trademarks does not constitute its endorsement or recommendation by the U.S. Government, Department of Health and Human Services, or Centers for Disease Control and Prevention.  

## 2018-08-24 NOTE — Progress Notes (Signed)
Temp 98.6 F (37 C) (Oral)   Ht 5\' 11"  (1.803 m)   Wt 231 lb (104.8 kg)   BMI 32.22 kg/m    Subjective:    Patient ID: Larry Cochran, male    DOB: 07/04/1960, 58 y.o.   MRN: [id number]  HPI: Larry GlowFrank Alaimo is a 58 y.o. male  Chief Complaint  Patient presents with  . COVID Exposure    Patient had family friend over 2 weeks ago. The friend's mother was diagnosed, but the patient's friend was asymptomatic. Patient states nasal congestion, ear pressure, and mild headache.     . This visit was completed via WebEx due to the restrictions of the COVID-19 pandemic. All issues as above were discussed and addressed. Physical exam was done as above through visual confirmation on WebEx. If it was felt that the patient should be evaluated in the office, they were directed there. The patient verbally consented to this visit. . Location of the patient: home . Location of the provider: home . Those involved with this call:  . Provider: Aura DialsJolene , DNP . CMA: Myrtha MantisKeri Bullock, CMA . Front Desk/Registration: Harriet PhoJoliza Johnson  . Time spent on call: 15 minutes with patient face to face via video conference. More than 50% of this time was spent in counseling and coordination of care. 10 minutes total spent in review of patient's record and preparation of their chart. I verified patient identity using two factors (patient name and date of birth). Patient consents verbally to being seen via telemedicine visit today.   UPPER RESPIRATORY TRACT INFECTION Daughter had family friend over two weeks ago whose mother was later diagnosed with Covid 8519 and now the family friend has been diagnosed, friend was asymptomatic at time of visit.  Patient now presents with headache, nasal congestion, and ear pressure.  Family friend and mother are both quarantined now.   Worst symptom: congestion, headache, ear pressure Fever: no Cough: no Shortness of breath: no Wheezing: no Chest pain: no Chest tightness: no Chest  congestion: no Nasal congestion: yes Runny nose: yes Post nasal drip: no Sneezing: no Sore throat: very small one Swollen glands: no Sinus pressure: no Headache: yes Face pain: no Toothache: no Ear pain: none Ear pressure: none Eyes red/itching:no Eye drainage/crusting: no  Vomiting: no Rash: no Fatigue: no Sick contacts: yes Strep contacts: no  Context: stable Recurrent sinusitis: no Relief with OTC cold/cough medications: no  Treatments attempted: none   Relevant past medical, surgical, family and social history reviewed and updated as indicated. Interim medical history since our last visit reviewed. Allergies and medications reviewed and updated.  Review of Systems  Constitutional: Negative for activity change, chills, diaphoresis, fatigue and fever.  HENT: Positive for congestion. Negative for ear discharge, ear pain, postnasal drip, rhinorrhea, sinus pressure, sinus pain, sore throat and voice change.   Respiratory: Negative for cough, chest tightness, shortness of breath and wheezing.   Cardiovascular: Negative for chest pain, palpitations and leg swelling.  Gastrointestinal: Negative for abdominal distention, abdominal pain, constipation, diarrhea, nausea and vomiting.  Endocrine: Negative for cold intolerance, heat intolerance, polydipsia, polyphagia and polyuria.  Musculoskeletal: Negative.   Skin: Negative.   Neurological: Positive for headaches. Negative for dizziness, syncope, weakness, light-headedness and numbness.  Psychiatric/Behavioral: Negative.     Per HPI unless specifically indicated above     Objective:    Temp 98.6 F (37 C) (Oral)   Ht 5\' 11"  (1.803 m)   Wt 231 lb (104.8 kg)   BMI 32.22  kg/m   Wt Readings from Last 3 Encounters:  08/24/18 231 lb (104.8 kg)  12/26/17 241 lb 6.4 oz (109.5 kg)  12/01/16 225 lb (102.1 kg)    Physical Exam Vitals signs and nursing note reviewed.  Constitutional:      General: He is awake. He is not in  acute distress.    Appearance: He is well-developed. He is not ill-appearing.  HENT:     Head: Normocephalic.     Right Ear: Hearing normal. No drainage.     Left Ear: Hearing normal. No drainage.  Eyes:     General: Lids are normal.        Right eye: No discharge.        Left eye: No discharge.     Conjunctiva/sclera: Conjunctivae normal.  Neck:     Musculoskeletal: Normal range of motion.  Cardiovascular:     Comments: Unable to auscultate due to virtual exam only Pulmonary:     Effort: Pulmonary effort is normal. No accessory muscle usage or respiratory distress.     Comments: Unable to auscultate due to virtual exam only Neurological:     Mental Status: He is alert and oriented to person, place, and time.  Psychiatric:        Mood and Affect: Mood normal.        Behavior: Behavior normal. Behavior is cooperative.        Thought Content: Thought content normal.        Judgment: Judgment normal.     Results for orders placed or performed in visit on 12/26/17  Comprehensive metabolic panel  Result Value Ref Range   Glucose 82 65 - 99 mg/dL   BUN 14 6 - 24 mg/dL   Creatinine, Ser 3.471.09 0.76 - 1.27 mg/dL   GFR calc non Af Amer 76 >59 mL/min/1.73   GFR calc Af Amer 88 >59 mL/min/1.73   BUN/Creatinine Ratio 13 9 - 20   Sodium 142 134 - 144 mmol/L   Potassium 4.1 3.5 - 5.2 mmol/L   Chloride 101 96 - 106 mmol/L   CO2 24 20 - 29 mmol/L   Calcium 8.7 8.7 - 10.2 mg/dL   Total Protein 6.3 6.0 - 8.5 g/dL   Albumin 4.4 3.5 - 5.5 g/dL   Globulin, Total 1.9 1.5 - 4.5 g/dL   Albumin/Globulin Ratio 2.3 (H) 1.2 - 2.2   Bilirubin Total 0.3 0.0 - 1.2 mg/dL   Alkaline Phosphatase 77 39 - 117 IU/L   AST 13 0 - 40 IU/L   ALT 24 0 - 44 IU/L  Lipid panel  Result Value Ref Range   Cholesterol, Total 215 (H) 100 - 199 mg/dL   Triglycerides 425236 (H) 0 - 149 mg/dL   HDL 39 (L) >95>39 mg/dL   VLDL Cholesterol Cal 47 (H) 5 - 40 mg/dL   LDL Calculated 638129 (H) 0 - 99 mg/dL   Chol/HDL Ratio 5.5  (H) 0.0 - 5.0 ratio  CBC with Differential/Platelet  Result Value Ref Range   WBC 6.9 3.4 - 10.8 x10E3/uL   RBC 5.05 4.14 - 5.80 x10E6/uL   Hemoglobin 14.6 13.0 - 17.7 g/dL   Hematocrit 75.643.4 43.337.5 - 51.0 %   MCV 86 79 - 97 fL   MCH 28.9 26.6 - 33.0 pg   MCHC 33.6 31.5 - 35.7 g/dL   RDW 29.513.0 18.812.3 - 41.615.4 %   Platelets 261 150 - 450 x10E3/uL   Neutrophils 58 Not Estab. %   Lymphs 34  Not Estab. %   Monocytes 6 Not Estab. %   Eos 1 Not Estab. %   Basos 1 Not Estab. %   Neutrophils Absolute 4.0 1.4 - 7.0 x10E3/uL   Lymphocytes Absolute 2.4 0.7 - 3.1 x10E3/uL   Monocytes Absolute 0.4 0.1 - 0.9 x10E3/uL   EOS (ABSOLUTE) 0.1 0.0 - 0.4 x10E3/uL   Basophils Absolute 0.1 0.0 - 0.2 x10E3/uL   Immature Granulocytes 0 Not Estab. %   Immature Grans (Abs) 0.0 0.0 - 0.1 x10E3/uL  TSH  Result Value Ref Range   TSH 1.790 0.450 - 4.500 uIU/mL  Urinalysis, Routine w reflex microscopic  Result Value Ref Range   Specific Gravity, UA 1.010 1.005 - 1.030   pH, UA 6.5 5.0 - 7.5   Color, UA Yellow Yellow   Appearance Ur Clear Clear   Leukocytes, UA Negative Negative   Protein, UA Negative Negative/Trace   Glucose, UA Negative Negative   Ketones, UA Negative Negative   RBC, UA Negative Negative   Bilirubin, UA Negative Negative   Urobilinogen, Ur 0.2 0.2 - 1.0 mg/dL   Nitrite, UA Negative Negative      Assessment & Plan:   Problem List Items Addressed This Visit      Other   Close Exposure to Covid-19 Virus    Will send message to [facility] outpatient team due to patient exposure and risk factors + symptoms.   Have recommended maintaining a 14 day at home quarantine at this time based on current symptoms and exposure + dependent on lab results.  Patient agrees with this POC.  They will return in 2 weeks for follow-up.         I discussed the assessment and treatment plan with the patient. The patient was provided an opportunity to ask questions and all were answered. The patient agreed with the  plan and demonstrated an understanding of the instructions.   The patient was advised to call back or seek an in-person evaluation if the symptoms worsen or if the condition fails to improve as anticipated.   I provided 15 minutes of time during this encounter.  Follow up plan: Return in about 2 weeks (around 09/07/2018) for Follow-up covid exposure.

## 2018-08-24 NOTE — Assessment & Plan Note (Signed)
Will send message to [facility] outpatient team due to patient exposure and risk factors + symptoms.   Have recommended maintaining a 14 day at home quarantine at this time based on current symptoms and exposure + dependent on lab results.  Patient agrees with this POC.  They will return in 2 weeks for follow-up.

## 2018-08-25 LAB — NOVEL CORONAVIRUS, NAA: SARS-CoV-2, NAA: NOT DETECTED

## 2018-09-04 ENCOUNTER — Encounter: Payer: Self-pay | Admitting: Family Medicine

## 2018-09-07 ENCOUNTER — Encounter: Payer: Self-pay | Admitting: Nurse Practitioner

## 2018-09-07 ENCOUNTER — Ambulatory Visit (INDEPENDENT_AMBULATORY_CARE_PROVIDER_SITE_OTHER): Payer: BC Managed Care – PPO | Admitting: Nurse Practitioner

## 2018-09-07 ENCOUNTER — Other Ambulatory Visit: Payer: Self-pay

## 2018-09-07 DIAGNOSIS — Z20828 Contact with and (suspected) exposure to other viral communicable diseases: Secondary | ICD-10-CM | POA: Diagnosis not present

## 2018-09-07 DIAGNOSIS — Z20822 Contact with and (suspected) exposure to covid-19: Secondary | ICD-10-CM

## 2018-09-07 NOTE — Assessment & Plan Note (Signed)
Covid testing returned negative and no current symptoms.  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  Return if any concerns or exposures.  Will resolve this problem at this time.

## 2018-09-07 NOTE — Progress Notes (Signed)
Temp 98.4 F (36.9 C) (Tympanic)   Ht 5\' 11"  (1.803 m)   Wt 231 lb (104.8 kg)   BMI 32.22 kg/m    Subjective:    Patient ID: Larry Cochran, male    DOB: July 02, 1960, 58 y.o.   MRN: [id number]  HPI: Larry Cochran is a 58 y.o. male  Chief Complaint  Patient presents with  . COVID19 Exposure F/U    Result was negative. Patient showing no symptoms.    . This visit was completed via WebEx due to the restrictions of the COVID-19 pandemic. All issues as above were discussed and addressed. Physical exam was done as above through visual confirmation on WebEx. If it was felt that the patient should be evaluated in the office, they were directed there. The patient verbally consented to this visit. . Location of the patient: home . Location of the provider: home . Those involved with this call:  . Provider: Marnee Guarneri, DNP . CMA: Merilyn Baba, CMA . Front Desk/Registration: Jill Side  . Time spent on call: 15 minutes with patient face to face via video conference. More than 50% of this time was spent in counseling and coordination of care. 10 minutes total spent in review of patient's record and preparation of their chart. I verified patient identity using two factors (patient name and date of birth). Patient consents verbally to being seen via telemedicine visit today.   COVID FOLLOW-UP: Seen 08/24/18 for exposure to Covid 19, testing returned negative and patient has no symptoms presenting, including no loss of taste or GI symptoms. Fever: no Cough: no Shortness of breath: no Wheezing: no Chest pain: no Chest tightness: no Chest congestion: no Nasal congestion: no Runny nose: no Post nasal drip: no Sneezing: no Sore throat: no Swollen glands: no Sinus pressure: no Headache: no Face pain: no Toothache: no Ear pain: none Ear pressure: none Eyes red/itching:no Eye drainage/crusting: no  Vomiting: no Rash: no Fatigue: no   Relevant past medical, surgical,  family and social history reviewed and updated as indicated. Interim medical history since our last visit reviewed. Allergies and medications reviewed and updated.  Review of Systems  Constitutional: Negative for activity change, diaphoresis, fatigue and fever.  HENT: Negative.   Respiratory: Negative for cough, chest tightness, shortness of breath and wheezing.   Cardiovascular: Negative for chest pain, palpitations and leg swelling.  Gastrointestinal: Negative for abdominal distention, abdominal pain, constipation, diarrhea, nausea and vomiting.  Musculoskeletal: Negative.   Skin: Negative.   Neurological: Negative for dizziness, syncope, weakness, light-headedness, numbness and headaches.  Psychiatric/Behavioral: Negative.     Per HPI unless specifically indicated above     Objective:    Temp 98.4 F (36.9 C) (Tympanic)   Ht 5\' 11"  (1.803 m)   Wt 231 lb (104.8 kg)   BMI 32.22 kg/m   Wt Readings from Last 3 Encounters:  09/07/18 231 lb (104.8 kg)  08/24/18 231 lb (104.8 kg)  12/26/17 241 lb 6.4 oz (109.5 kg)    Physical Exam Vitals signs and nursing note reviewed.  Constitutional:      General: He is awake. He is not in acute distress.    Appearance: He is well-developed. He is not ill-appearing.  HENT:     Head: Normocephalic.     Right Ear: Hearing normal. No drainage.     Left Ear: Hearing normal. No drainage.  Eyes:     General: Lids are normal.        Right eye: No discharge.  Left eye: No discharge.     Conjunctiva/sclera: Conjunctivae normal.  Neck:     Musculoskeletal: Normal range of motion.  Cardiovascular:     Comments: Unable to auscultate due to virtual exam only  Pulmonary:     Effort: Pulmonary effort is normal. No accessory muscle usage or respiratory distress.     Comments: Unable to auscultate due to virtual exam only  Abdominal:     Comments: Unable to auscultate due to virtual exam only   Neurological:     Mental Status: He is  alert and oriented to person, place, and time.  Psychiatric:        Mood and Affect: Mood normal.        Behavior: Behavior normal. Behavior is cooperative.        Thought Content: Thought content normal.        Judgment: Judgment normal.     Results for orders placed or performed in visit on 08/24/18  Novel Coronavirus, NAA ([facility])  Result Value Ref Range   SARS-CoV-2, NAA Not Detected Not Detected      Assessment & Plan:   Problem List Items Addressed This Visit      Other   RESOLVED: Close Exposure to Covid-19 Virus    Covid testing returned negative and no current symptoms.  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  Return if any concerns or exposures.  Will resolve this problem at this time.         I discussed the assessment and treatment plan with the patient. The patient was provided an opportunity to ask questions and all were answered. The patient agreed with the plan and demonstrated an understanding of the instructions.   The patient was advised to call back or seek an in-person evaluation if the symptoms worsen or if the condition fails to improve as anticipated.   I provided 15 minutes of time during this encounter.  Follow up plan: Return if symptoms worsen or fail to improve.

## 2018-09-07 NOTE — Patient Instructions (Signed)

## 2018-10-19 DIAGNOSIS — H5213 Myopia, bilateral: Secondary | ICD-10-CM | POA: Diagnosis not present

## 2018-11-15 DIAGNOSIS — G4733 Obstructive sleep apnea (adult) (pediatric): Secondary | ICD-10-CM | POA: Diagnosis not present

## 2018-12-15 DIAGNOSIS — D485 Neoplasm of uncertain behavior of skin: Secondary | ICD-10-CM | POA: Diagnosis not present

## 2018-12-15 DIAGNOSIS — D2262 Melanocytic nevi of left upper limb, including shoulder: Secondary | ICD-10-CM | POA: Diagnosis not present

## 2018-12-15 DIAGNOSIS — D2261 Melanocytic nevi of right upper limb, including shoulder: Secondary | ICD-10-CM | POA: Diagnosis not present

## 2018-12-15 DIAGNOSIS — D225 Melanocytic nevi of trunk: Secondary | ICD-10-CM | POA: Diagnosis not present

## 2018-12-15 DIAGNOSIS — L400 Psoriasis vulgaris: Secondary | ICD-10-CM | POA: Diagnosis not present

## 2018-12-15 DIAGNOSIS — D2239 Melanocytic nevi of other parts of face: Secondary | ICD-10-CM | POA: Diagnosis not present

## 2018-12-19 LAB — CBC AND DIFFERENTIAL
HCT: 44 (ref 41–53)
Hemoglobin: 14.5 (ref 13.5–17.5)
Platelets: 238 (ref 150–399)
WBC: 6.5

## 2018-12-19 LAB — PSA: PSA: 2.5

## 2018-12-22 DIAGNOSIS — Z79899 Other long term (current) drug therapy: Secondary | ICD-10-CM | POA: Diagnosis not present

## 2018-12-22 DIAGNOSIS — N401 Enlarged prostate with lower urinary tract symptoms: Secondary | ICD-10-CM | POA: Diagnosis not present

## 2018-12-22 DIAGNOSIS — E291 Testicular hypofunction: Secondary | ICD-10-CM | POA: Diagnosis not present

## 2018-12-22 DIAGNOSIS — N5201 Erectile dysfunction due to arterial insufficiency: Secondary | ICD-10-CM | POA: Diagnosis not present

## 2019-01-01 ENCOUNTER — Ambulatory Visit: Payer: BC Managed Care – PPO | Admitting: Family Medicine

## 2019-01-05 DIAGNOSIS — D2239 Melanocytic nevi of other parts of face: Secondary | ICD-10-CM | POA: Diagnosis not present

## 2019-01-19 ENCOUNTER — Ambulatory Visit: Payer: BC Managed Care – PPO | Admitting: Family Medicine

## 2019-02-12 ENCOUNTER — Other Ambulatory Visit: Payer: Self-pay

## 2019-02-13 ENCOUNTER — Encounter: Payer: Self-pay | Admitting: Family Medicine

## 2019-02-13 ENCOUNTER — Other Ambulatory Visit: Payer: Self-pay

## 2019-02-13 ENCOUNTER — Ambulatory Visit: Payer: BC Managed Care – PPO | Admitting: Family Medicine

## 2019-02-13 VITALS — BP 119/78 | HR 60 | Ht 70.47 in | Wt 243.2 lb

## 2019-02-13 DIAGNOSIS — Z0000 Encounter for general adult medical examination without abnormal findings: Secondary | ICD-10-CM

## 2019-02-13 DIAGNOSIS — Z13220 Encounter for screening for lipoid disorders: Secondary | ICD-10-CM

## 2019-02-13 DIAGNOSIS — I10 Essential (primary) hypertension: Secondary | ICD-10-CM | POA: Diagnosis not present

## 2019-02-13 DIAGNOSIS — G4733 Obstructive sleep apnea (adult) (pediatric): Secondary | ICD-10-CM | POA: Diagnosis not present

## 2019-02-13 DIAGNOSIS — Z23 Encounter for immunization: Secondary | ICD-10-CM

## 2019-02-13 DIAGNOSIS — E291 Testicular hypofunction: Secondary | ICD-10-CM | POA: Diagnosis not present

## 2019-02-13 DIAGNOSIS — Z9989 Dependence on other enabling machines and devices: Secondary | ICD-10-CM

## 2019-02-13 LAB — UA/M W/RFLX CULTURE, ROUTINE
Bilirubin, UA: NEGATIVE
Glucose, UA: NEGATIVE
Ketones, UA: NEGATIVE
Leukocytes,UA: NEGATIVE
Nitrite, UA: NEGATIVE
Protein,UA: NEGATIVE
Specific Gravity, UA: 1.015 (ref 1.005–1.030)
Urobilinogen, Ur: 0.2 mg/dL (ref 0.2–1.0)
pH, UA: 6.5 (ref 5.0–7.5)

## 2019-02-13 LAB — MICROALBUMIN, URINE WAIVED
Creatinine, Urine Waived: 50 mg/dL (ref 10–300)
Microalb, Ur Waived: 10 mg/L (ref 0–19)
Microalb/Creat Ratio: <30 mg/g (ref <30)

## 2019-02-13 LAB — MICROSCOPIC EXAMINATION
Bacteria, UA: NONE SEEN
WBC, UA: NONE SEEN /hpf (ref 0–5)

## 2019-02-13 MED ORDER — BENAZEPRIL HCL 40 MG PO TABS: 40.0000 mg | ORAL_TABLET | Freq: Every day | ORAL | 1 refills | Status: DC

## 2019-02-13 MED ORDER — LANSOPRAZOLE 15 MG PO CPDR: 15.0000 mg | DELAYED_RELEASE_CAPSULE | Freq: Every day | ORAL | 3 refills | Status: DC

## 2019-02-13 NOTE — Assessment & Plan Note (Signed)
Under good control on current regimen. Continue current regimen. Continue to monitor. Call with any concerns. Refills given. Labs drawn today.   

## 2019-02-13 NOTE — Progress Notes (Signed)
BP 119/78   Pulse 60   Ht 5' 10.47" (1.79 m)   Wt 243 lb 4 oz (110.3 kg)   SpO2 97%   BMI 34.44 kg/m    Subjective:    Patient ID: Larry Cochran, male    DOB: 1961/01/29, 58 y.o.   MRN: [id number]  HPI: Larry Cochran is a 58 y.o. male presenting on 02/13/2019 for comprehensive medical examination. Current medical complaints include:  HYPERTENSION Hypertension status: controlled  Satisfied with current treatment? no Duration of hypertension: chronic BP monitoring frequency:  not checking BP medication side effects:  no Medication compliance: excellent compliance Previous BP meds: Aspirin: yes Recurrent headaches: no Visual changes: no Palpitations: no Dyspnea: no Chest pain: no Lower extremity edema: no Dizzy/lightheaded: no  LOW TESTOSTERONE Duration: chronic Status: controlled  Satisfied with current treatment:  yes Medication side effects:  no Medication compliance: excellent compliance Decreased libido: no Fatigue: no Depressed mood: no Muscle weakness: no Erectile dysfunction: no  Interim Problems from his last visit: no  Depression Screen done today and results listed below:  Depression screen [facility] 2/9 02/13/2019 12/26/2017 12/01/2016 11/11/2015  Decreased Interest 0 0 0 0  Down, Depressed, Hopeless 0 0 0 0  PHQ - 2 Score 0 0 0 0  Altered sleeping - 0 - -  Tired, decreased energy - 0 - -  Change in appetite - 0 - -  Feeling bad or failure about yourself  - 0 - -  Trouble concentrating - 0 - -  Moving slowly or fidgety/restless - 0 - -  Suicidal thoughts - 0 - -  PHQ-9 Score - 0 - -    Past Medical History:  Past Medical History:  Diagnosis Date  . Hypertension   . Low back pain     Surgical History:  History reviewed. No pertinent surgical history.  Medications:  Current Outpatient Medications on File Prior to Visit  Medication Sig  . aspirin EC 81 MG tablet Take 81 mg by mouth daily.  . calcipotriene-betamethasone (TACLONEX SCALP)  external suspension Apply topically as needed.  Marland Kitchen ketoconazole (NIZORAL) 2 % shampoo   . sildenafil (REVATIO) 20 MG tablet Take 20 mg by mouth daily.   . Testosterone Propionate (FIRST-TESTOSTERONE MC) 2 % CREA Place 200 mg/mL onto the skin 2 (two) times daily. 20% compounded cream  . Vitamin D, Cholecalciferol, 10 MCG (400 UNIT) TABS Take 1 Dose by mouth daily.   No current facility-administered medications on file prior to visit.     Allergies:  No Known Allergies  Social History:  Social History   Socioeconomic History  . Marital status: Single    Spouse name: Not on file  . Number of children: Not on file  . Years of education: Not on file  . Highest education level: Not on file  Occupational History  . Not on file  Social Needs  . Financial resource strain: Not on file  . Food insecurity    Worry: Not on file    Inability: Not on file  . Transportation needs    Medical: Not on file    Non-medical: Not on file  Tobacco Use  . Smoking status: Former Smoker    Types: Cigars    Quit date: 10/28/2009    Years since quitting: 9.3  . Smokeless tobacco: Never Used  Substance and Sexual Activity  . Alcohol use: Yes    Alcohol/week: 0.0 standard drinks    Comment: social  . Drug use: No  .  Sexual activity: Not on file  Lifestyle  . Physical activity    Days per week: Not on file    Minutes per session: Not on file  . Stress: Not on file  Relationships  . Social Herbalist on phone: Not on file    Gets together: Not on file    Attends religious service: Not on file    Active member of club or organization: Not on file    Attends meetings of clubs or organizations: Not on file    Relationship status: Not on file  . Intimate partner violence    Fear of current or ex partner: Not on file    Emotionally abused: Not on file    Physically abused: Not on file    Forced sexual activity: Not on file  Other Topics Concern  . Not on file  Social History  Narrative  . Not on file   Social History   Tobacco Use  Smoking Status Former Smoker  . Types: Cigars  . Quit date: 10/28/2009  . Years since quitting: 9.3  Smokeless Tobacco Never Used   Social History   Substance and Sexual Activity  Alcohol Use Yes  . Alcohol/week: 0.0 standard drinks   Comment: social    Family History:  Family History  Problem Relation Age of Onset  . AAA (abdominal aortic aneurysm) Mother   . Diabetes Father   . Diabetes Paternal Grandmother   . Heart disease Paternal Grandmother   . Diabetes Paternal Grandfather   . Bladder Cancer Brother   . Benign prostatic hyperplasia Brother     Past medical history, surgical history, medications, allergies, family history and social history reviewed with patient today and changes made to appropriate areas of the chart.   Review of Systems  Constitutional: Negative.   HENT: Negative.   Eyes: Negative.   Respiratory: Negative.   Cardiovascular: Negative.   Gastrointestinal: Negative.   Genitourinary: Negative.   Musculoskeletal: Negative.   Skin: Negative.   Neurological: Negative.   Endo/Heme/Allergies: Negative.   Psychiatric/Behavioral: Negative.     All other ROS negative except what is listed above and in the HPI.      Objective:    BP 119/78   Pulse 60   Ht 5' 10.47" (1.79 m)   Wt 243 lb 4 oz (110.3 kg)   SpO2 97%   BMI 34.44 kg/m   Wt Readings from Last 3 Encounters:  02/13/19 243 lb 4 oz (110.3 kg)  09/07/18 231 lb (104.8 kg)  08/24/18 231 lb (104.8 kg)    Physical Exam Vitals signs and nursing note reviewed.  Constitutional:      General: He is not in acute distress.    Appearance: Normal appearance. He is obese. He is not ill-appearing, toxic-appearing or diaphoretic.  HENT:     Head: Normocephalic and atraumatic.     Right Ear: Tympanic membrane, ear canal and external ear normal. There is no impacted cerumen.     Left Ear: Tympanic membrane, ear canal and external ear  normal. There is no impacted cerumen.     Nose: Nose normal. No congestion or rhinorrhea.     Mouth/Throat:     Mouth: Mucous membranes are moist.     Pharynx: Oropharynx is clear. No oropharyngeal exudate or posterior oropharyngeal erythema.  Eyes:     General: No scleral icterus.       Right eye: No discharge.        Left eye:  No discharge.     Extraocular Movements: Extraocular movements intact.     Conjunctiva/sclera: Conjunctivae normal.     Pupils: Pupils are equal, round, and reactive to light.  Neck:     Musculoskeletal: Normal range of motion and neck supple. No neck rigidity or muscular tenderness.     Vascular: No carotid bruit.  Cardiovascular:     Rate and Rhythm: Normal rate and regular rhythm.     Pulses: Normal pulses.     Heart sounds: No murmur. No friction rub. No gallop.   Pulmonary:     Effort: Pulmonary effort is normal. No respiratory distress.     Breath sounds: Normal breath sounds. No stridor. No wheezing, rhonchi or rales.  Chest:     Chest wall: No tenderness.  Abdominal:     General: Abdomen is flat. Bowel sounds are normal. There is no distension.     Palpations: Abdomen is soft. There is no mass.     Tenderness: There is no abdominal tenderness. There is no right CVA tenderness, left CVA tenderness, guarding or rebound.     Hernia: No hernia is present.  Genitourinary:    Comments: Genital exam deferred with shared decision making Musculoskeletal:        General: No swelling, tenderness, deformity or signs of injury.     Right lower leg: No edema.     Left lower leg: No edema.  Lymphadenopathy:     Cervical: No cervical adenopathy.  Skin:    General: Skin is warm and dry.     Capillary Refill: Capillary refill takes less than 2 seconds.     Coloration: Skin is not jaundiced or pale.     Findings: No bruising, erythema, lesion or rash.  Neurological:     General: No focal deficit present.     Mental Status: He is alert and oriented to person,  place, and time.     Cranial Nerves: No cranial nerve deficit.     Sensory: No sensory deficit.     Motor: No weakness.     Coordination: Coordination normal.     Gait: Gait normal.     Deep Tendon Reflexes: Reflexes normal.  Psychiatric:        Mood and Affect: Mood normal.        Behavior: Behavior normal.        Thought Content: Thought content normal.        Judgment: Judgment normal.     Results for orders placed or performed in visit on 02/13/19  CBC and differential  Result Value Ref Range   Hemoglobin 14.5 13.5 - 17.5   HCT 44 41 - 53   Platelets 238 150 - 399   WBC 6.5   PSA  Result Value Ref Range   PSA 2.5       Assessment & Plan:   Problem List Items Addressed This Visit      Cardiovascular and Mediastinum   Hypertension    Under good control on current regimen. Continue current regimen. Continue to monitor. Call with any concerns. Refills given. Labs drawn today.       Relevant Medications   benazepril (LOTENSIN) 40 MG tablet   Other Relevant Orders   Comp Met (CMET)   Microalbumin, Urine Waived   TSH   UA/M w/rflx Culture, Routine     Respiratory   OSA on CPAP    No concerns. Continue CPAP. Call with any concerns.       Relevant  Orders   Comp Met (CMET)     Endocrine   Hypogonadism in male    Followed by urology. Stable. Continue to monitor.       Relevant Orders   Comp Met (CMET)    Other Visit Diagnoses    Routine general medical examination at a health care facility    -  Primary   Vaccines up to date. Screening labs checked today. Colonoscopy up to date. Continue diet and exercise. Call with any concerns.    Screening for cholesterol level       Labs drawn today. Await results. Call with any concerns.    Relevant Orders   Lipid Panel w/o Chol/HDL Ratio OUT   Immunization due       Tdap given today.   Relevant Orders   Tdap (Completed)       LABORATORY TESTING:  Health maintenance labs ordered today as discussed above.    The natural history of prostate cancer and ongoing controversy regarding screening and potential treatment outcomes of prostate cancer has been discussed with the patient. The meaning of a false positive PSA and a false negative PSA has been discussed. He indicates understanding of the limitations of this screening test and wishes to proceed with screening PSA testing.   IMMUNIZATIONS:   - Tdap: Tetanus vaccination status reviewed: tdap. - Influenza: Up to date - Pneumovax: Not applicable  SCREENING: - Colonoscopy: Up to date  Discussed with patient purpose of the colonoscopy is to detect colon cancer at curable precancerous or early stages   PATIENT COUNSELING:    Sexuality: Discussed sexually transmitted diseases, partner selection, use of condoms, avoidance of unintended pregnancy  and contraceptive alternatives.   Advised to avoid cigarette smoking.  I discussed with the patient that most people either abstain from alcohol or drink within safe limits (<=14/week and <=4 drinks/occasion for males, <=7/weeks and <= 3 drinks/occasion for females) and that the risk for alcohol disorders and other health effects rises proportionally with the number of drinks per week and how often a drinker exceeds daily limits.  Discussed cessation/primary prevention of drug use and availability of treatment for abuse.   Diet: Encouraged to adjust caloric intake to maintain  or achieve ideal body weight, to reduce intake of dietary saturated fat and total fat, to limit sodium intake by avoiding high sodium foods and not adding table salt, and to maintain adequate dietary potassium and calcium preferably from fresh fruits, vegetables, and low-fat dairy products.    stressed the importance of regular exercise  Injury prevention: Discussed safety belts, safety helmets, smoke detector, smoking near bedding or upholstery.   Dental health: Discussed importance of regular tooth brushing, flossing, and dental  visits.   Follow up plan: NEXT PREVENTATIVE PHYSICAL DUE IN 1 YEAR. Return in about 6 months (around 08/14/2019).

## 2019-02-13 NOTE — Patient Instructions (Signed)
Health Maintenance, Male Adopting a healthy lifestyle and getting preventive care are important in promoting health and wellness. Ask your health care provider about:  The right schedule for you to have regular tests and exams.  Things you can do on your own to prevent diseases and keep yourself healthy. What should I know about diet, weight, and exercise? Eat a healthy diet   Eat a diet that includes plenty of vegetables, fruits, low-fat dairy products, and lean protein.  Do not eat a lot of foods that are high in solid fats, added sugars, or sodium. Maintain a healthy weight Body mass index (BMI) is a measurement that can be used to identify possible weight problems. It estimates body fat based on height and weight. Your health care provider can help determine your BMI and help you achieve or maintain a healthy weight. Get regular exercise Get regular exercise. This is one of the most important things you can do for your health. Most adults should:  Exercise for at least 150 minutes each week. The exercise should increase your heart rate and make you sweat (moderate-intensity exercise).  Do strengthening exercises at least twice a week. This is in addition to the moderate-intensity exercise.  Spend less time sitting. Even light physical activity can be beneficial. Watch cholesterol and blood lipids Have your blood tested for lipids and cholesterol at 57 years of age, then have this test every 5 years. You may need to have your cholesterol levels checked more often if:  Your lipid or cholesterol levels are high.  You are older than 56 years of age.  You are at high risk for heart disease. What should I know about cancer screening? Many types of cancers can be detected early and may often be prevented. Depending on your health history and family history, you may need to have cancer screening at various ages. This may include screening for:  Colorectal cancer.  Prostate cancer.   Skin cancer.  Lung cancer. What should I know about heart disease, diabetes, and high blood pressure? Blood pressure and heart disease  High blood pressure causes heart disease and increases the risk of stroke. This is more likely to develop in people who have high blood pressure readings, are of African descent, or are overweight.  Talk with your health care provider about your target blood pressure readings.  Have your blood pressure checked: ? Every 3-5 years if you are 79-12 years of age. ? Every year if you are 26 years old or older.  If you are between the ages of 57 and 39 and are a current or former smoker, ask your health care provider if you should have a one-time screening for abdominal aortic aneurysm (AAA). Diabetes Have regular diabetes screenings. This checks your fasting blood sugar level. Have the screening done:  Once every three years after age 15 if you are at a normal weight and have a low risk for diabetes.  More often and at a younger age if you are overweight or have a high risk for diabetes. What should I know about preventing infection? Hepatitis B If you have a higher risk for hepatitis B, you should be screened for this virus. Talk with your health care provider to find out if you are at risk for hepatitis B infection. Hepatitis C Blood testing is recommended for:  Everyone born from [birth year] through 1963.  Anyone with known risk factors for hepatitis C. Sexually transmitted infections (STIs)  You should be screened each year  for STIs, including gonorrhea and chlamydia, if: ? You are sexually active and are younger than 56 years of age. ? You are older than 56 years of age and your health care provider tells you that you are at risk for this type of infection. ? Your sexual activity has changed since you were last screened, and you are at increased risk for chlamydia or gonorrhea. Ask your health care provider if you are at risk.  Ask your health care  provider about whether you are at high risk for HIV. Your health care provider may recommend a prescription medicine to help prevent HIV infection. If you choose to take medicine to prevent HIV, you should first get tested for HIV. You should then be tested every 3 months for as long as you are taking the medicine. Follow these instructions at home: Lifestyle  Do not use any products that contain nicotine or tobacco, such as cigarettes, e-cigarettes, and chewing tobacco. If you need help quitting, ask your health care provider.  Do not use street drugs.  Do not share needles.  Ask your health care provider for help if you need support or information about quitting drugs. Alcohol use  Do not drink alcohol if your health care provider tells you not to drink.  If you drink alcohol: ? Limit how much you have to 0-2 drinks a day. ? Be aware of how much alcohol is in your drink. In the [place] one drink equals one 12 oz bottle of beer (355 mL), one 5 oz glass of wine (148 mL), or one 1 oz glass of hard liquor (44 mL). General instructions  Schedule regular health, dental, and eye exams.  Stay current with your vaccines.  Tell your health care provider if: ? You often feel depressed. ? You have ever been abused or do not feel safe at home. Summary  Adopting a healthy lifestyle and getting preventive care are important in promoting health and wellness.  Follow your health care provider's instructions about healthy diet, exercising, and getting tested or screened for diseases.  Follow your health care provider's instructions on monitoring your cholesterol and blood pressure. This information is not intended to replace advice given to you by your health care provider. Make sure you discuss any questions you have with your health care provider. Document Released: 08/21/2007 Document Revised: 02/15/2018 Document Reviewed: 02/15/2018 Elsevier Patient Education  2020 Elsevier Inc.    https://www.cdc.gov/vaccines/hcp/vis/vis-statements/tdap.pdf">  Tdap Vaccine (Tetanus, Diphtheria and Pertussis): What You Need to Know 1. Why get vaccinated? Tetanus, diphtheria and pertussis are very serious diseases. Tdap vaccine can protect us from these diseases. And, Tdap vaccine given to pregnant women can protect newborn babies against pertussis.. TETANUS (Lockjaw) is rare in the [place] today. It causes painful muscle tightening and stiffness, usually all over the body.  It can lead to tightening of muscles in the head and neck so you can't open your mouth, swallow, or sometimes even breathe. Tetanus kills about 1 out of 10 people who are infected even after receiving the best medical care. DIPHTHERIA is also rare in the [place] today. It can cause a thick coating to form in the back of the throat.  It can lead to breathing problems, heart failure, paralysis, and death. PERTUSSIS (Whooping Cough) causes severe coughing spells, which can cause difficulty breathing, vomiting and disturbed sleep.  It can also lead to weight loss, incontinence, and rib fractures. Up to 2 in 100 adolescents and 5 in 100 adults with   pertussis are hospitalized or have complications, which could include pneumonia or death. These diseases are caused by bacteria. Diphtheria and pertussis are spread from person to person through secretions from coughing or sneezing. Tetanus enters the body through cuts, scratches, or wounds. Before vaccines, as many as 200,000 cases of diphtheria, 200,000 cases of pertussis, and hundreds of cases of tetanus, were reported in the [place] each year. Since vaccination began, reports of cases for tetanus and diphtheria have dropped by about 99% and for pertussis by about 80%. 2. Tdap vaccine Tdap vaccine can protect adolescents and adults from tetanus, diphtheria, and pertussis. One dose of Tdap is routinely given at age 11 or 12. People who did not get Tdap at that age  should get it as soon as possible. Tdap is especially important for healthcare professionals and anyone having close contact with a baby younger than 12 months. Pregnant women should get a dose of Tdap during every pregnancy, to protect the newborn from pertussis. Infants are most at risk for severe, life-threatening complications from pertussis. Another vaccine, called Td, protects against tetanus and diphtheria, but not pertussis. A Td booster should be given every 10 years. Tdap may be given as one of these boosters if you have never gotten Tdap before. Tdap may also be given after a severe cut or burn to prevent tetanus infection. Your doctor or the person giving you the vaccine can give you more information. Tdap may safely be given at the same time as other vaccines. 3. Some people should not get this vaccine  A person who has ever had a life-threatening allergic reaction after a previous dose of any diphtheria, tetanus or pertussis containing vaccine, OR has a severe allergy to any part of this vaccine, should not get Tdap vaccine. Tell the person giving the vaccine about any severe allergies.  Anyone who had coma or long repeated seizures within 7 days after a childhood dose of DTP or DTaP, or a previous dose of Tdap, should not get Tdap, unless a cause other than the vaccine was found. They can still get Td.  Talk to your doctor if you: ? have seizures or another nervous system problem, ? had severe pain or swelling after any vaccine containing diphtheria, tetanus or pertussis, ? ever had a condition called Guillain-Barr Syndrome (GBS), ? aren't feeling well on the day the shot is scheduled. 4. Risks With any medicine, including vaccines, there is a chance of side effects. These are usually mild and go away on their own. Serious reactions are also possible but are rare. Most people who get Tdap vaccine do not have any problems with it. Mild problems following Tdap (Did not interfere  with activities)  Pain where the shot was given (about 3 in 4 adolescents or 2 in 3 adults)  Redness or swelling where the shot was given (about 1 person in 5)  Mild fever of at least 100.4F (up to about 1 in 25 adolescents or 1 in 100 adults)  Headache (about 3 or 4 people in 10)  Tiredness (about 1 person in 3 or 4)  Nausea, vomiting, diarrhea, stomach ache (up to 1 in 4 adolescents or 1 in 10 adults)  Chills, sore joints (about 1 person in 10)  Body aches (about 1 person in 3 or 4)  Rash, swollen glands (uncommon) Moderate problems following Tdap (Interfered with activities, but did not require medical attention)  Pain where the shot was given (up to 1 in 5 or 6)    Redness or swelling where the shot was given (up to about 1 in 16 adolescents or 1 in 12 adults)  Fever over 102F (about 1 in 100 adolescents or 1 in 250 adults)  Headache (about 1 in 7 adolescents or 1 in 10 adults)  Nausea, vomiting, diarrhea, stomach ache (up to 1 or 3 people in 100)  Swelling of the entire arm where the shot was given (up to about 1 in 500). Severe problems following Tdap (Unable to perform usual activities; required medical attention)  Swelling, severe pain, bleeding and redness in the arm where the shot was given (rare). Problems that could happen after any vaccine:  People sometimes faint after a medical procedure, including vaccination. Sitting or lying down for about 15 minutes can help prevent fainting, and injuries caused by a fall. Tell your doctor if you feel dizzy, or have vision changes or ringing in the ears.  Some people get severe pain in the shoulder and have difficulty moving the arm where a shot was given. This happens very rarely.  Any medication can cause a severe allergic reaction. Such reactions from a vaccine are very rare, estimated at fewer than 1 in a million doses, and would happen within a few minutes to a few hours after the vaccination. As with any medicine,  there is a very remote chance of a vaccine causing a serious injury or death. The safety of vaccines is always being monitored. For more information, visit: www.cdc.gov/vaccinesafety/ 5. What if there is a serious problem? What should I look for?  Look for anything that concerns you, such as signs of a severe allergic reaction, very high fever, or unusual behavior. Signs of a severe allergic reaction can include hives, swelling of the face and throat, difficulty breathing, a fast heartbeat, dizziness, and weakness. These would usually start a few minutes to a few hours after the vaccination. What should I do?  If you think it is a severe allergic reaction or other emergency that can't wait, call 9-1-1 or get the person to the nearest hospital. Otherwise, call your doctor.  Afterward, the reaction should be reported to the Vaccine Adverse Event Reporting System (VAERS). Your doctor might file this report, or you can do it yourself through the VAERS web site at www.vaers.hhs.gov, or by calling 1-800-822-7967. VAERS does not give medical advice. 6. The National Vaccine Injury Compensation Program The National Vaccine Injury Compensation Program (VICP) is a federal program that was created to compensate people who may have been injured by certain vaccines. Persons who believe they may have been injured by a vaccine can learn about the program and about filing a claim by calling 1-800-338-2382 or visiting the VICP website at www.hrsa.gov/vaccinecompensation. There is a time limit to file a claim for compensation. 7. How can I learn more?  Ask your doctor. He or she can give you the vaccine package insert or suggest other sources of information.  Call your local or state health department.  Contact the Centers for Disease Control and Prevention (CDC): ? Call 1-800-232-4636 (1-800-CDC-INFO) or ? Visit CDC's website at www.cdc.gov/vaccines Vaccine Information Statement Tdap Vaccine (05/01/2013) This  information is not intended to replace advice given to you by your health care provider. Make sure you discuss any questions you have with your health care provider. Document Released: 08/24/2011 Document Revised: 10/10/2017 Document Reviewed: 10/10/2017 Elsevier Interactive Patient Education  2020 Elsevier Inc.  

## 2019-02-13 NOTE — Assessment & Plan Note (Signed)
Followed by urology. Stable. Continue to monitor.  

## 2019-02-13 NOTE — Assessment & Plan Note (Signed)
No concerns. Continue CPAP. Call with any concerns.

## 2019-02-14 LAB — COMPREHENSIVE METABOLIC PANEL WITH GFR
ALT: 24 IU/L (ref 0–44)
AST: 14 IU/L (ref 0–40)
Albumin/Globulin Ratio: 1.9 (ref 1.2–2.2)
Albumin: 4.3 g/dL (ref 3.8–4.9)
Alkaline Phosphatase: 85 IU/L (ref 39–117)
BUN/Creatinine Ratio: 11 (ref 9–20)
BUN: 19 mg/dL (ref 6–24)
Bilirubin Total: 0.2 mg/dL (ref 0.0–1.2)
CO2: 23 mmol/L (ref 20–29)
Calcium: 9.2 mg/dL (ref 8.7–10.2)
Chloride: 97 mmol/L (ref 96–106)
Creatinine, Ser: 1.72 mg/dL — ABNORMAL HIGH (ref 0.76–1.27)
GFR calc Af Amer: 50 mL/min/1.73 — ABNORMAL LOW
GFR calc non Af Amer: 43 mL/min/1.73 — ABNORMAL LOW
Globulin, Total: 2.3 g/dL (ref 1.5–4.5)
Glucose: 76 mg/dL (ref 65–99)
Potassium: 4.3 mmol/L (ref 3.5–5.2)
Sodium: 142 mmol/L (ref 134–144)
Total Protein: 6.6 g/dL (ref 6.0–8.5)

## 2019-02-14 LAB — LIPID PANEL W/O CHOL/HDL RATIO
Cholesterol, Total: 226 mg/dL — ABNORMAL HIGH (ref 100–199)
HDL: 41 mg/dL
LDL Chol Calc (NIH): 135 mg/dL — ABNORMAL HIGH (ref 0–99)
Triglycerides: 278 mg/dL — ABNORMAL HIGH (ref 0–149)
VLDL Cholesterol Cal: 50 mg/dL — ABNORMAL HIGH (ref 5–40)

## 2019-02-14 LAB — TSH: TSH: 1.46 u[IU]/mL (ref 0.450–4.500)

## 2019-02-20 ENCOUNTER — Telehealth: Payer: Self-pay | Admitting: Family Medicine

## 2019-02-20 DIAGNOSIS — N289 Disorder of kidney and ureter, unspecified: Secondary | ICD-10-CM

## 2019-02-20 NOTE — Telephone Encounter (Signed)
Patient notified, lab appointment scheduled on 03/15/18

## 2019-02-20 NOTE — Telephone Encounter (Signed)
Please let him know that his labs look pretty good, but his kidney function was up a little bit. I'd like him to drink a bunch of water and come back after Christmas for [place] to recheck.

## 2019-03-16 ENCOUNTER — Other Ambulatory Visit: Payer: Self-pay

## 2019-03-20 ENCOUNTER — Other Ambulatory Visit: Payer: BC Managed Care – PPO

## 2019-03-20 ENCOUNTER — Other Ambulatory Visit: Payer: Self-pay

## 2019-03-20 DIAGNOSIS — N289 Disorder of kidney and ureter, unspecified: Secondary | ICD-10-CM

## 2019-03-21 LAB — BASIC METABOLIC PANEL
BUN/Creatinine Ratio: 17 (ref 9–20)
BUN: 19 mg/dL (ref 6–24)
CO2: 26 mmol/L (ref 20–29)
Calcium: 8.3 mg/dL — ABNORMAL LOW (ref 8.7–10.2)
Chloride: 101 mmol/L (ref 96–106)
Creatinine, Ser: 1.15 mg/dL (ref 0.76–1.27)
GFR calc Af Amer: 82 mL/min/{1.73_m2} (ref >59)
GFR calc non Af Amer: 71 mL/min/{1.73_m2} (ref >59)
Glucose: 113 mg/dL — ABNORMAL HIGH (ref 65–99)
Potassium: 4.6 mmol/L (ref 3.5–5.2)
Sodium: 138 mmol/L (ref 134–144)

## 2019-03-23 DIAGNOSIS — G4733 Obstructive sleep apnea (adult) (pediatric): Secondary | ICD-10-CM | POA: Diagnosis not present

## 2019-03-26 DIAGNOSIS — B349 Viral infection, unspecified: Secondary | ICD-10-CM | POA: Diagnosis not present

## 2019-05-08 DIAGNOSIS — Z0189 Encounter for other specified special examinations: Secondary | ICD-10-CM | POA: Diagnosis not present

## 2019-05-09 DIAGNOSIS — Z043 Encounter for examination and observation following other accident: Secondary | ICD-10-CM | POA: Diagnosis not present

## 2019-05-09 DIAGNOSIS — E785 Hyperlipidemia, unspecified: Secondary | ICD-10-CM | POA: Diagnosis not present

## 2019-05-09 DIAGNOSIS — Z713 Dietary counseling and surveillance: Secondary | ICD-10-CM | POA: Diagnosis not present

## 2019-05-11 DIAGNOSIS — R7301 Impaired fasting glucose: Secondary | ICD-10-CM | POA: Insufficient documentation

## 2019-06-29 DIAGNOSIS — N401 Enlarged prostate with lower urinary tract symptoms: Secondary | ICD-10-CM | POA: Diagnosis not present

## 2019-06-29 DIAGNOSIS — E6609 Other obesity due to excess calories: Secondary | ICD-10-CM | POA: Diagnosis not present

## 2019-06-29 DIAGNOSIS — Z79899 Other long term (current) drug therapy: Secondary | ICD-10-CM | POA: Diagnosis not present

## 2019-06-29 DIAGNOSIS — Z0189 Encounter for other specified special examinations: Secondary | ICD-10-CM | POA: Diagnosis not present

## 2019-06-29 DIAGNOSIS — E291 Testicular hypofunction: Secondary | ICD-10-CM | POA: Diagnosis not present

## 2019-07-25 ENCOUNTER — Other Ambulatory Visit: Payer: Self-pay | Admitting: Family Medicine

## 2019-07-25 DIAGNOSIS — I10 Essential (primary) hypertension: Secondary | ICD-10-CM

## 2019-07-25 NOTE — Telephone Encounter (Signed)
Requested Prescriptions  Pending Prescriptions Disp Refills  . benazepril (LOTENSIN) 40 MG tablet [Pharmacy Med Name: BENAZEPRIL HCL 40 MG TABLET] 90 tablet 0    Sig: TAKE 1 TABLET BY MOUTH EVERY DAY     Cardiovascular:  ACE Inhibitors Passed - 07/25/2019  1:18 AM      Passed - Cr in normal range and within 180 days    Creatinine, Ser  Date Value Ref Range Status  03/20/2019 1.15 0.76 - 1.27 mg/dL Final         Passed - K in normal range and within 180 days    Potassium  Date Value Ref Range Status  03/20/2019 4.6 3.5 - 5.2 mmol/L Final         Passed - Patient is not pregnant      Passed - Last BP in normal range    BP Readings from Last 1 Encounters:  02/13/19 119/78         Passed - Valid encounter within last 6 months    Recent Outpatient Visits          5 months ago Routine general medical examination at a health care facility   [facility], Megan P, DO   10 months ago Close Exposure to Covid-19 Virus   [facility] [place], Corrie Dandy T, NP   11 months ago Close Exposure to [employer] Virus   [facility] [place], Dorie Rank, NP   1 year ago Essential hypertension   [facility] [place], Redge Gainer, MD   1 year ago PE (physical exam), annual   [facility] [place], Redge Gainer, MD      Future Appointments            In 1 month Johnson, Oralia Rud, DO [facility]

## 2019-08-17 ENCOUNTER — Ambulatory Visit (INDEPENDENT_AMBULATORY_CARE_PROVIDER_SITE_OTHER): Payer: BC Managed Care – PPO | Admitting: Family Medicine

## 2019-08-17 ENCOUNTER — Other Ambulatory Visit: Payer: Self-pay

## 2019-08-17 ENCOUNTER — Encounter: Payer: Self-pay | Admitting: Family Medicine

## 2019-08-17 VITALS — BP 116/78 | HR 59 | Temp 98.2°F | Ht 70.4 in | Wt 225.2 lb

## 2019-08-17 DIAGNOSIS — R7301 Impaired fasting glucose: Secondary | ICD-10-CM

## 2019-08-17 DIAGNOSIS — I10 Essential (primary) hypertension: Secondary | ICD-10-CM

## 2019-08-17 LAB — BAYER DCA HB A1C WAIVED: HB A1C (BAYER DCA - WAIVED): 4.9 % (ref <7.0)

## 2019-08-17 MED ORDER — BENAZEPRIL HCL 40 MG PO TABS: 40.0000 mg | ORAL_TABLET | Freq: Every day | ORAL | 1 refills | Status: DC

## 2019-08-17 NOTE — Assessment & Plan Note (Signed)
Under good control on current regimen. Continue current regimen. Continue to monitor. Call with any concerns. Refills given. Labs drawn today.   

## 2019-08-17 NOTE — Progress Notes (Signed)
BP 116/78   Pulse (!) 59   Temp 98.2 F (36.8 C)   Ht 5' 10.4" (1.788 m)   Wt 225 lb 4 oz (102.2 kg)   SpO2 99%   BMI 31.95 kg/m    Subjective:    Patient ID: Larry Cochran, male    DOB: [birth year], 59 y.o.   MRN: [id number]  HPI: Larry Cochran is a 59 y.o. male  Chief Complaint  Patient presents with  . Hypertension  . IFG   HYPERTENSION Hypertension status: controlled  Satisfied with current treatment? yes Duration of hypertension: chronic BP monitoring frequency:  not checking BP medication side effects:  no Medication compliance: excellent compliance Previous BP meds: benazepril Aspirin: yes Recurrent headaches: no Visual changes: no Palpitations: no Dyspnea: no Chest pain: no Lower extremity edema: no Dizzy/lightheaded: no  Impaired Fasting Glucose HbA1C:  Lab Results  Component Value Date   HGBA1C 5.8 04/19/2017   Duration of elevated blood sugar: chronic Polydipsia: no Polyuria: no Weight change: no Visual disturbance: no Glucose Monitoring: no    Accucheck frequency: Not Checking Diabetic Education: Completed Family history of diabetes: no  Relevant past medical, surgical, family and social history reviewed and updated as indicated. Interim medical history since our last visit reviewed. Allergies and medications reviewed and updated.  Review of Systems  Constitutional: Negative.   Respiratory: Negative.   Cardiovascular: Negative.   Gastrointestinal: Negative.   Genitourinary: Negative.   Psychiatric/Behavioral: Negative.     Per HPI unless specifically indicated above     Objective:    BP 116/78   Pulse (!) 59   Temp 98.2 F (36.8 C)   Ht 5' 10.4" (1.788 m)   Wt 225 lb 4 oz (102.2 kg)   SpO2 99%   BMI 31.95 kg/m   Wt Readings from Last 3 Encounters:  08/17/19 225 lb 4 oz (102.2 kg)  02/13/19 243 lb 4 oz (110.3 kg)  09/07/18 231 lb (104.8 kg)    Physical Exam Vitals and nursing note reviewed.  Constitutional:       General: He is not in acute distress.    Appearance: Normal appearance. He is not ill-appearing, toxic-appearing or diaphoretic.  HENT:     Head: Normocephalic and atraumatic.     Right Ear: External ear normal.     Left Ear: External ear normal.     Nose: Nose normal.     Mouth/Throat:     Mouth: Mucous membranes are moist.     Pharynx: Oropharynx is clear.  Eyes:     General: No scleral icterus.       Right eye: No discharge.        Left eye: No discharge.     Extraocular Movements: Extraocular movements intact.     Conjunctiva/sclera: Conjunctivae normal.     Pupils: Pupils are equal, round, and reactive to light.  Cardiovascular:     Rate and Rhythm: Normal rate and regular rhythm.     Pulses: Normal pulses.     Heart sounds: Normal heart sounds. No murmur heard.  No friction rub. No gallop.   Pulmonary:     Effort: Pulmonary effort is normal. No respiratory distress.     Breath sounds: Normal breath sounds. No stridor. No wheezing, rhonchi or rales.  Chest:     Chest wall: No tenderness.  Musculoskeletal:        General: Normal range of motion.     Cervical back: Normal range of motion and neck supple.  Skin:    General: Skin is warm and dry.     Capillary Refill: Capillary refill takes less than 2 seconds.     Coloration: Skin is not jaundiced or pale.     Findings: No bruising, erythema, lesion or rash.  Neurological:     General: No focal deficit present.     Mental Status: He is alert and oriented to person, place, and time. Mental status is at baseline.  Psychiatric:        Mood and Affect: Mood normal.        Behavior: Behavior normal.        Thought Content: Thought content normal.        Judgment: Judgment normal.     Results for orders placed or performed in visit on 42/59/56  Basic Metabolic Panel (BMET)  Result Value Ref Range   Glucose 113 (H) 65 - 99 mg/dL   BUN 19 6 - 24 mg/dL   Creatinine, Ser 1.15 0.76 - 1.27 mg/dL   GFR calc non Af Amer 71 >59  mL/min/1.73   GFR calc Af Amer 82 >59 mL/min/1.73   BUN/Creatinine Ratio 17 9 - 20   Sodium 138 134 - 144 mmol/L   Potassium 4.6 3.5 - 5.2 mmol/L   Chloride 101 96 - 106 mmol/L   CO2 26 20 - 29 mmol/L   Calcium 8.3 (L) 8.7 - 10.2 mg/dL      Assessment & Plan:   Problem List Items Addressed This Visit      Cardiovascular and Mediastinum   Hypertension - Primary    Under good control on current regimen. Continue current regimen. Continue to monitor. Call with any concerns. Refills given. Labs drawn today.       Relevant Medications   benazepril (LOTENSIN) 40 MG tablet   Other Relevant Orders   Basic metabolic panel     Endocrine   Impaired fasting glucose    Under good control on current regimen. Continue current regimen. Continue to monitor. Call with any concerns. Refills given. Labs drawn today.       Relevant Orders   Bayer DCA Hb A1c Waived       Follow up plan: Return in about 6 months (around 02/16/2020) for Physical.

## 2019-08-18 LAB — BASIC METABOLIC PANEL
BUN/Creatinine Ratio: 17 (ref 9–20)
BUN: 16 mg/dL (ref 6–24)
CO2: 23 mmol/L (ref 20–29)
Calcium: 9.5 mg/dL (ref 8.7–10.2)
Chloride: 101 mmol/L (ref 96–106)
Creatinine, Ser: 0.94 mg/dL (ref 0.76–1.27)
GFR calc Af Amer: 104 mL/min/{1.73_m2} (ref >59)
GFR calc non Af Amer: 90 mL/min/{1.73_m2} (ref >59)
Glucose: 102 mg/dL — ABNORMAL HIGH (ref 65–99)
Potassium: 4.4 mmol/L (ref 3.5–5.2)
Sodium: 138 mmol/L (ref 134–144)

## 2019-08-24 ENCOUNTER — Ambulatory Visit: Payer: BC Managed Care – PPO | Admitting: Family Medicine

## 2019-11-14 ENCOUNTER — Encounter: Payer: Self-pay | Admitting: Family Medicine

## 2019-12-14 DIAGNOSIS — L298 Other pruritus: Secondary | ICD-10-CM | POA: Diagnosis not present

## 2019-12-14 DIAGNOSIS — L570 Actinic keratosis: Secondary | ICD-10-CM | POA: Diagnosis not present

## 2019-12-14 DIAGNOSIS — G4733 Obstructive sleep apnea (adult) (pediatric): Secondary | ICD-10-CM | POA: Diagnosis not present

## 2019-12-14 DIAGNOSIS — D225 Melanocytic nevi of trunk: Secondary | ICD-10-CM | POA: Diagnosis not present

## 2019-12-14 DIAGNOSIS — L400 Psoriasis vulgaris: Secondary | ICD-10-CM | POA: Diagnosis not present

## 2019-12-14 DIAGNOSIS — D485 Neoplasm of uncertain behavior of skin: Secondary | ICD-10-CM | POA: Diagnosis not present

## 2019-12-14 DIAGNOSIS — D2261 Melanocytic nevi of right upper limb, including shoulder: Secondary | ICD-10-CM | POA: Diagnosis not present

## 2019-12-21 DIAGNOSIS — Z79899 Other long term (current) drug therapy: Secondary | ICD-10-CM | POA: Diagnosis not present

## 2019-12-21 DIAGNOSIS — N5201 Erectile dysfunction due to arterial insufficiency: Secondary | ICD-10-CM | POA: Diagnosis not present

## 2019-12-21 DIAGNOSIS — E291 Testicular hypofunction: Secondary | ICD-10-CM | POA: Diagnosis not present

## 2019-12-24 DIAGNOSIS — L570 Actinic keratosis: Secondary | ICD-10-CM | POA: Diagnosis not present

## 2019-12-24 DIAGNOSIS — Z23 Encounter for immunization: Secondary | ICD-10-CM | POA: Diagnosis not present

## 2019-12-24 DIAGNOSIS — Z0189 Encounter for other specified special examinations: Secondary | ICD-10-CM | POA: Diagnosis not present

## 2020-01-14 ENCOUNTER — Telehealth: Payer: Self-pay

## 2020-01-14 NOTE — Telephone Encounter (Signed)
Pharmacy updated in patient's chart.

## 2020-01-14 NOTE — Telephone Encounter (Signed)
Pt's wife presented in office, they would like to change his pharmacy to [facility] in [place]

## 2020-02-22 ENCOUNTER — Encounter: Payer: BC Managed Care – PPO | Admitting: Family Medicine

## 2020-03-10 DIAGNOSIS — J00 Acute nasopharyngitis [common cold]: Secondary | ICD-10-CM | POA: Diagnosis not present

## 2020-03-10 DIAGNOSIS — R059 Cough, unspecified: Secondary | ICD-10-CM | POA: Diagnosis not present

## 2020-03-12 ENCOUNTER — Encounter: Payer: BC Managed Care – PPO | Admitting: Family Medicine

## 2020-03-15 DIAGNOSIS — Z7189 Other specified counseling: Secondary | ICD-10-CM | POA: Diagnosis not present

## 2020-03-15 DIAGNOSIS — Z23 Encounter for immunization: Secondary | ICD-10-CM | POA: Diagnosis not present

## 2020-03-23 ENCOUNTER — Other Ambulatory Visit: Payer: Self-pay | Admitting: Family Medicine

## 2020-03-23 DIAGNOSIS — I10 Essential (primary) hypertension: Secondary | ICD-10-CM

## 2020-03-23 NOTE — Telephone Encounter (Signed)
   Notes to clinic: Patient has appt on 03/25/2020   Requested Prescriptions  Pending Prescriptions Disp Refills   benazepril (LOTENSIN) 40 MG tablet [Pharmacy Med Name: BENAZEPRIL HCL 40 MG TABLET] 90 tablet 1    Sig: TAKE 1 TABLET BY MOUTH EVERY DAY      Cardiovascular:  ACE Inhibitors Failed - 03/23/2020  9:30 AM      Failed - Cr in normal range and within 180 days    Creatinine, Ser  Date Value Ref Range Status  08/17/2019 0.94 0.76 - 1.27 mg/dL Final          Failed - K in normal range and within 180 days    Potassium  Date Value Ref Range Status  08/17/2019 4.4 3.5 - 5.2 mmol/L Final          Failed - Valid encounter within last 6 months    Recent Outpatient Visits           7 months ago Essential hypertension   [facility] [place], Megan P, DO   1 year ago Routine general medical examination at a health care facility   [facility] [place] P, DO   1 year ago Close Exposure to Covid-19 Virus   [facility] D[facility] T, NP   1 year ago Close Exposure to Covid-19 Virus   [facility] [place], Dorie Rank, NP   1 year ago Essential hypertension   [facility] [place], Redge Gainer, MD       Future Appointments             In 2 days Dorcas Carrow, DO [facility]             Passed - Patient is not pregnant      Passed - Last BP in normal range    BP Readings from Last 1 Encounters:  08/17/19 116/78

## 2020-03-25 ENCOUNTER — Encounter: Payer: BC Managed Care – PPO | Admitting: Family Medicine

## 2020-04-02 DIAGNOSIS — G4733 Obstructive sleep apnea (adult) (pediatric): Secondary | ICD-10-CM | POA: Diagnosis not present

## 2020-04-08 DIAGNOSIS — Z0189 Encounter for other specified special examinations: Secondary | ICD-10-CM | POA: Diagnosis not present

## 2020-04-09 LAB — BASIC METABOLIC PANEL
BUN: 15 (ref 4–21)
Chloride: 101 (ref 99–108)
Creatinine: 1 (ref 0.6–1.3)
Glucose: 111
Potassium: 4.7 (ref 3.4–5.3)
Sodium: 139 (ref 137–147)
Sodium: 139 (ref 137–147)

## 2020-04-09 LAB — HEPATIC FUNCTION PANEL
ALT: 16 (ref 10–40)
AST: 24 (ref 14–40)
Alkaline Phosphatase: 145 — AB (ref 25–125)
Bilirubin, Total: 0.4

## 2020-04-09 LAB — LIPID PANEL
Cholesterol: 222 — AB (ref 0–200)
HDL: 34 — AB (ref 35–70)
LDL Cholesterol: 137
Triglycerides: 279 — AB (ref 40–160)

## 2020-04-09 LAB — TSH: TSH: 1.36 (ref 0.41–5.90)

## 2020-04-09 LAB — COMPREHENSIVE METABOLIC PANEL
Albumin: 4.3 (ref 3.5–5.0)
Calcium: 8.9 (ref 8.7–10.7)
Globulin: 2.2

## 2020-04-09 LAB — CBC AND DIFFERENTIAL
HCT: 45 (ref 41–53)
Hemoglobin: 15.1 (ref 13.5–17.5)
Platelets: 257 (ref 150–399)
WBC: 6.8

## 2020-04-09 LAB — TESTOSTERONE: Testosterone: 370

## 2020-04-09 LAB — VITAMIN D 25 HYDROXY (VIT D DEFICIENCY, FRACTURES): Vit D, 25-Hydroxy: 41.9

## 2020-04-09 LAB — HEMOGLOBIN A1C: Hemoglobin A1C: 6

## 2020-04-10 DIAGNOSIS — Z043 Encounter for examination and observation following other accident: Secondary | ICD-10-CM | POA: Diagnosis not present

## 2020-04-10 DIAGNOSIS — Z713 Dietary counseling and surveillance: Secondary | ICD-10-CM | POA: Diagnosis not present

## 2020-04-10 DIAGNOSIS — I10 Essential (primary) hypertension: Secondary | ICD-10-CM | POA: Diagnosis not present

## 2020-04-11 ENCOUNTER — Other Ambulatory Visit: Payer: Self-pay

## 2020-04-11 ENCOUNTER — Ambulatory Visit (INDEPENDENT_AMBULATORY_CARE_PROVIDER_SITE_OTHER): Payer: BC Managed Care – PPO | Admitting: Family Medicine

## 2020-04-11 ENCOUNTER — Encounter: Payer: Self-pay | Admitting: Family Medicine

## 2020-04-11 VITALS — BP 124/78 | HR 61 | Temp 98.8°F | Ht 70.0 in | Wt 246.8 lb

## 2020-04-11 DIAGNOSIS — I10 Essential (primary) hypertension: Secondary | ICD-10-CM

## 2020-04-11 DIAGNOSIS — Z0000 Encounter for general adult medical examination without abnormal findings: Secondary | ICD-10-CM | POA: Diagnosis not present

## 2020-04-11 DIAGNOSIS — R7301 Impaired fasting glucose: Secondary | ICD-10-CM | POA: Diagnosis not present

## 2020-04-11 MED ORDER — BENAZEPRIL HCL 40 MG PO TABS: 40.0000 mg | ORAL_TABLET | Freq: Every day | ORAL | 1 refills | Status: DC

## 2020-04-11 MED ORDER — LANSOPRAZOLE 15 MG PO CPDR: 15.0000 mg | DELAYED_RELEASE_CAPSULE | Freq: Every day | ORAL | 3 refills | Status: DC

## 2020-04-11 NOTE — Progress Notes (Signed)
BP 124/78   Pulse 61   Temp 98.8 F (37.1 C)   Ht 5\' 10"  (1.778 m)   Wt 246 lb 12.8 oz (111.9 kg)   SpO2 98%   BMI 35.41 kg/m    Subjective:    Patient ID: Larry Cochran, male    DOB: 09/23/1960, 60 y.o.   MRN: [id number]  HPI: Larry Cochran is a 60 y.o. male presenting on 04/11/2020 for comprehensive medical examination. Current medical complaints include:  HYPERTENSION Hypertension status: controlled  Satisfied with current treatment? no Duration of hypertension: chronic BP monitoring frequency:  not checking BP medication side effects:  no Medication compliance: excellent compliance Previous BP meds: benazepril Aspirin: yes Recurrent headaches: no Visual changes: no Palpitations: no Dyspnea: no Chest pain: no Lower extremity edema: no Dizzy/lightheaded: no  Interim Problems from his last visit: no  Depression Screen done today and results listed below:  Depression screen [facility] 2/9 04/11/2020 02/13/2019 12/26/2017 12/01/2016 11/11/2015  Decreased Interest 0 0 0 0 0  Down, Depressed, Hopeless 0 0 0 0 0  PHQ - 2 Score 0 0 0 0 0  Altered sleeping - - 0 - -  Tired, decreased energy - - 0 - -  Change in appetite - - 0 - -  Feeling bad or failure about yourself  - - 0 - -  Trouble concentrating - - 0 - -  Moving slowly or fidgety/restless - - 0 - -  Suicidal thoughts - - 0 - -  PHQ-9 Score - - 0 - -    Past Medical History:  Past Medical History:  Diagnosis Date  . Hypertension   . Low back pain     Surgical History:  Past Surgical History:  Procedure Laterality Date  . SKIN BIOPSY    . SKIN LESION EXCISION      Medications:  Current Outpatient Medications on File Prior to Visit  Medication Sig  . aspirin EC 81 MG tablet Take 81 mg by mouth daily.  . calcipotriene-betamethasone (TACLONEX SCALP) external suspension Apply topically as needed.  Marland Kitchen ketoconazole (NIZORAL) 2 % shampoo   . sildenafil (REVATIO) 20 MG tablet Take 20 mg by mouth daily.   .  Testosterone Propionate (FIRST-TESTOSTERONE MC) 2 % CREA Place 200 mg/mL onto the skin 2 (two) times daily. 20% compounded cream  . Vitamin D, Cholecalciferol, 10 MCG (400 UNIT) TABS Take 1 Dose by mouth daily.   No current facility-administered medications on file prior to visit.    Allergies:  No Known Allergies  Social History:  Social History   Socioeconomic History  . Marital status: Single    Spouse name: Not on file  . Number of children: Not on file  . Years of education: Not on file  . Highest education level: Not on file  Occupational History  . Not on file  Tobacco Use  . Smoking status: Former Smoker    Types: Cigars    Quit date: 10/28/2009    Years since quitting: 10.4  . Smokeless tobacco: Never Used  Vaping Use  . Vaping Use: Never used  Substance and Sexual Activity  . Alcohol use: Yes    Alcohol/week: 0.0 standard drinks    Comment: social  . Drug use: No  . Sexual activity: Not on file  Other Topics Concern  . Not on file  Social History Narrative  . Not on file   Social Determinants of Health   Financial Resource Strain: Not on file  Food Insecurity:  Not on file  Transportation Needs: Not on file  Physical Activity: Not on file  Stress: Not on file  Social Connections: Not on file  Intimate Partner Violence: Not on file   Social History   Tobacco Use  Smoking Status Former Smoker  . Types: Cigars  . Quit date: 10/28/2009  . Years since quitting: 10.4  Smokeless Tobacco Never Used   Social History   Substance and Sexual Activity  Alcohol Use Yes  . Alcohol/week: 0.0 standard drinks   Comment: social    Family History:  Family History  Problem Relation Age of Onset  . AAA (abdominal aortic aneurysm) Mother   . Diabetes Father   . Diabetes Paternal Grandmother   . Heart disease Paternal Grandmother   . Diabetes Paternal Grandfather   . Bladder Cancer Brother   . Benign prostatic hyperplasia Brother     Past medical history,  surgical history, medications, allergies, family history and social history reviewed with patient today and changes made to appropriate areas of the chart.   Review of Systems  Constitutional: Negative.   HENT: Negative.   Eyes: Negative.   Respiratory: Negative.   Cardiovascular: Negative.   Gastrointestinal: Negative.   Genitourinary: Negative.   Musculoskeletal: Positive for myalgias. Negative for back pain, falls, joint pain and neck pain.  Skin: Negative.   Neurological: Negative.   Endo/Heme/Allergies: Negative.   Psychiatric/Behavioral: Negative.    All other ROS negative except what is listed above and in the HPI.      Objective:    BP 124/78   Pulse 61   Temp 98.8 F (37.1 C)   Ht 5\' 10"  (1.778 m)   Wt 246 lb 12.8 oz (111.9 kg)   SpO2 98%   BMI 35.41 kg/m   Wt Readings from Last 3 Encounters:  04/11/20 246 lb 12.8 oz (111.9 kg)  08/17/19 225 lb 4 oz (102.2 kg)  02/13/19 243 lb 4 oz (110.3 kg)    Physical Exam Vitals and nursing note reviewed.  Constitutional:      General: He is not in acute distress.    Appearance: Normal appearance. He is obese. He is not ill-appearing, toxic-appearing or diaphoretic.  HENT:     Head: Normocephalic and atraumatic.     Right Ear: Tympanic membrane, ear canal and external ear normal. There is no impacted cerumen.     Left Ear: Tympanic membrane, ear canal and external ear normal. There is no impacted cerumen.     Nose: Nose normal. No congestion or rhinorrhea.     Mouth/Throat:     Mouth: Mucous membranes are moist.     Pharynx: Oropharynx is clear. No oropharyngeal exudate or posterior oropharyngeal erythema.  Eyes:     General: No scleral icterus.       Right eye: No discharge.        Left eye: No discharge.     Extraocular Movements: Extraocular movements intact.     Conjunctiva/sclera: Conjunctivae normal.     Pupils: Pupils are equal, round, and reactive to light.  Neck:     Vascular: No carotid bruit.   Cardiovascular:     Rate and Rhythm: Normal rate and regular rhythm.     Pulses: Normal pulses.     Heart sounds: No murmur heard. No friction rub. No gallop.   Pulmonary:     Effort: Pulmonary effort is normal. No respiratory distress.     Breath sounds: Normal breath sounds. No stridor. No wheezing, rhonchi or  rales.  Chest:     Chest wall: No tenderness.  Abdominal:     General: Abdomen is flat. Bowel sounds are normal. There is no distension.     Palpations: Abdomen is soft. There is no mass.     Tenderness: There is no abdominal tenderness. There is no right CVA tenderness, left CVA tenderness, guarding or rebound.     Hernia: No hernia is present.  Genitourinary:    Comments: Genital exam deferred with shared decision making Musculoskeletal:        General: No swelling, tenderness, deformity or signs of injury.     Cervical back: Normal range of motion and neck supple. No rigidity. No muscular tenderness.     Right lower leg: No edema.     Left lower leg: No edema.  Lymphadenopathy:     Cervical: No cervical adenopathy.  Skin:    General: Skin is warm and dry.     Capillary Refill: Capillary refill takes less than 2 seconds.     Coloration: Skin is not jaundiced or pale.     Findings: No bruising, erythema, lesion or rash.  Neurological:     General: No focal deficit present.     Mental Status: He is alert and oriented to person, place, and time.     Cranial Nerves: No cranial nerve deficit.     Sensory: No sensory deficit.     Motor: No weakness.     Coordination: Coordination normal.     Gait: Gait normal.     Deep Tendon Reflexes: Reflexes normal.  Psychiatric:        Mood and Affect: Mood normal.        Behavior: Behavior normal.        Thought Content: Thought content normal.        Judgment: Judgment normal.     Results for orders placed or performed in visit on 04/11/20  Basic metabolic panel  Result Value Ref Range   Sodium 139 137 - 147  CBC and  differential  Result Value Ref Range   Hemoglobin 15.1 13.5 - 17.5   HCT 45 41 - 53   Platelets 257 150 - 399   WBC 6.8   VITAMIN D 25 Hydroxy (Vit-D Deficiency, Fractures)  Result Value Ref Range   Vit D, 25-Hydroxy 41.9   Basic metabolic panel  Result Value Ref Range   Glucose 111    BUN 15 4 - 21   Creatinine 1.0 0.6 - 1.3   Potassium 4.7 3.4 - 5.3   Sodium 139 137 - 147   Chloride 101 99 - 108  Comprehensive metabolic panel  Result Value Ref Range   Globulin 2.2    Calcium 8.9 8.7 - 10.7   Albumin 4.3 3.5 - 5.0  Lipid panel  Result Value Ref Range   Triglycerides 279 (A) 40 - 160   Cholesterol 222 (A) 0 - 200   HDL 34 (A) 35 - 70   LDL Cholesterol 137   Hepatic function panel  Result Value Ref Range   Alkaline Phosphatase 145 (A) 25 - 125   ALT 16 10 - 40   AST 24 14 - 40   Bilirubin, Total 0.4   Hemoglobin A1c  Result Value Ref Range   Hemoglobin A1C 6.0   Testosterone  Result Value Ref Range   Testosterone 370   TSH  Result Value Ref Range   TSH 1.36 0.41 - 5.90      Assessment &  Plan:   Problem List Items Addressed This Visit      Cardiovascular and Mediastinum   Hypertension    Under good control on current regimen. Continue current regimen. Continue to monitor. Call with any concerns. Refills given. Labs drawn today.        Relevant Medications   benazepril (LOTENSIN) 40 MG tablet     Endocrine   Impaired fasting glucose    Rechecking labs today. Await results. Treat as needed.        Other Visit Diagnoses    Routine general medical examination at a health care facility    -  Primary   Vaccines up to date. Screening labs checked today. Colonoscopy up to date. Continue diet and exercise. Call with any concerns.    Essential hypertension       Relevant Medications   benazepril (LOTENSIN) 40 MG tablet       LABORATORY TESTING:  Health maintenance labs ordered today as discussed above.   The natural history of prostate cancer and  ongoing controversy regarding screening and potential treatment outcomes of prostate cancer has been discussed with the patient. The meaning of a false positive PSA and a false negative PSA has been discussed. He indicates understanding of the limitations of this screening test and wishes to proceed with screening PSA testing.   IMMUNIZATIONS:   - Tdap: Tetanus vaccination status reviewed: last tetanus booster within 10 years. - Influenza: Up to date - Pneumovax: Up to date - COVID: Up to date  SCREENING: - Colonoscopy: Up to date  Discussed with patient purpose of the colonoscopy is to detect colon cancer at curable precancerous or early stages   PATIENT COUNSELING:    Sexuality: Discussed sexually transmitted diseases, partner selection, use of condoms, avoidance of unintended pregnancy  and contraceptive alternatives.   Advised to avoid cigarette smoking.  I discussed with the patient that most people either abstain from alcohol or drink within safe limits (<=14/week and <=4 drinks/occasion for males, <=7/weeks and <= 3 drinks/occasion for females) and that the risk for alcohol disorders and other health effects rises proportionally with the number of drinks per week and how often a drinker exceeds daily limits.  Discussed cessation/primary prevention of drug use and availability of treatment for abuse.   Diet: Encouraged to adjust caloric intake to maintain  or achieve ideal body weight, to reduce intake of dietary saturated fat and total fat, to limit sodium intake by avoiding high sodium foods and not adding table salt, and to maintain adequate dietary potassium and calcium preferably from fresh fruits, vegetables, and low-fat dairy products.    stressed the importance of regular exercise  Injury prevention: Discussed safety belts, safety helmets, smoke detector, smoking near bedding or upholstery.   Dental health: Discussed importance of regular tooth brushing, flossing, and  dental visits.   Follow up plan: NEXT PREVENTATIVE PHYSICAL DUE IN 1 YEAR. Return in about 6 months (around 10/09/2020) for follow up BP.

## 2020-04-11 NOTE — Patient Instructions (Addendum)
Voltaren gel (diclofenac)   Health Maintenance, Male Adopting a healthy lifestyle and getting preventive care are important in promoting health and wellness. Ask your health care provider about:  The right schedule for you to have regular tests and exams.  Things you can do on your own to prevent diseases and keep yourself healthy. What should I know about diet, weight, and exercise? Eat a healthy diet  Eat a diet that includes plenty of vegetables, fruits, low-fat dairy products, and lean protein.  Do not eat a lot of foods that are high in solid fats, added sugars, or sodium.   Maintain a healthy weight Body mass index (BMI) is a measurement that can be used to identify possible weight problems. It estimates body fat based on height and weight. Your health care provider can help determine your BMI and help you achieve or maintain a healthy weight. Get regular exercise Get regular exercise. This is one of the most important things you can do for your health. Most adults should:  Exercise for at least 150 minutes each week. The exercise should increase your heart rate and make you sweat (moderate-intensity exercise).  Do strengthening exercises at least twice a week. This is in addition to the moderate-intensity exercise.  Spend less time sitting. Even light physical activity can be beneficial. Watch cholesterol and blood lipids Have your blood tested for lipids and cholesterol at 58 years of age, then have this test every 5 years. You may need to have your cholesterol levels checked more often if:  Your lipid or cholesterol levels are high.  You are older than 58 years of age.  You are at high risk for heart disease. What should I know about cancer screening? Many types of cancers can be detected early and may often be prevented. Depending on your health history and family history, you may need to have cancer screening at various ages. This may include screening for:  Colorectal  cancer.  Prostate cancer.  Skin cancer.  Lung cancer. What should I know about heart disease, diabetes, and high blood pressure? Blood pressure and heart disease  High blood pressure causes heart disease and increases the risk of stroke. This is more likely to develop in people who have high blood pressure readings, are of African descent, or are overweight.  Talk with your health care provider about your target blood pressure readings.  Have your blood pressure checked: ? Every 3-5 years if you are 41-39 years of age. ? Every year if you are 50 years old or older.  If you are between the ages of 22 and 53 and are a current or former smoker, ask your health care provider if you should have a one-time screening for abdominal aortic aneurysm (AAA). Diabetes Have regular diabetes screenings. This checks your fasting blood sugar level. Have the screening done:  Once every three years after age 68 if you are at a normal weight and have a low risk for diabetes.  More often and at a younger age if you are overweight or have a high risk for diabetes. What should I know about preventing infection? Hepatitis B If you have a higher risk for hepatitis B, you should be screened for this virus. Talk with your health care provider to find out if you are at risk for hepatitis B infection. Hepatitis C Blood testing is recommended for:  Everyone born from [birth year] through 1963.  Anyone with known risk factors for hepatitis C. Sexually transmitted infections (STIs)  You should be screened each year for STIs, including gonorrhea and chlamydia, if: ? You are sexually active and are younger than 58 years of age. ? You are older than 58 years of age and your health care provider tells you that you are at risk for this type of infection. ? Your sexual activity has changed since you were last screened, and you are at increased risk for chlamydia or gonorrhea. Ask your health care provider if you are at  risk.  Ask your health care provider about whether you are at high risk for HIV. Your health care provider may recommend a prescription medicine to help prevent HIV infection. If you choose to take medicine to prevent HIV, you should first get tested for HIV. You should then be tested every 3 months for as long as you are taking the medicine. Follow these instructions at home: Lifestyle  Do not use any products that contain nicotine or tobacco, such as cigarettes, e-cigarettes, and chewing tobacco. If you need help quitting, ask your health care provider.  Do not use street drugs.  Do not share needles.  Ask your health care provider for help if you need support or information about quitting drugs. Alcohol use  Do not drink alcohol if your health care provider tells you not to drink.  If you drink alcohol: ? Limit how much you have to 0-2 drinks a day. ? Be aware of how much alcohol is in your drink. In the [place] one drink equals one 12 oz bottle of beer (355 mL), one 5 oz glass of wine (148 mL), or one 1 oz glass of hard liquor (44 mL). General instructions  Schedule regular health, dental, and eye exams.  Stay current with your vaccines.  Tell your health care provider if: ? You often feel depressed. ? You have ever been abused or do not feel safe at home. Summary  Adopting a healthy lifestyle and getting preventive care are important in promoting health and wellness.  Follow your health care provider's instructions about healthy diet, exercising, and getting tested or screened for diseases.  Follow your health care provider's instructions on monitoring your cholesterol and blood pressure. This information is not intended to replace advice given to you by your health care provider. Make sure you discuss any questions you have with your health care provider. Document Revised: 02/15/2018 Document Reviewed: 02/15/2018 Elsevier Patient Education  2021 [employer]. Diclofenac  Topical Skin Gel What is this medicine? DICLOFENAC (dye KLOE fen ak) is a non-steroidal anti-inflammatory drug, also known as an NSAID. It is used to treat pain, inflammation, and swelling. This medicine may be used for other purposes; ask your health care provider or pharmacist if you have questions. COMMON BRAND NAME(S): DSG Pak, Omeca, Solaravix, Solaraze, ValcoPrep-100, VennGel One, Voltaren Arthritis, Voltaren Gel What should I tell my health care provider before I take this medicine? They need to know if you have any of these conditions:  bleeding disorders  coronary artery bypass graft (CABG) within the past 2 weeks  if you often drink alcohol  heart attack  heart disease  heart failure  high blood pressure  kidney disease  large area of burned or damaged skin  liver disease  low red blood cell counts  lung or breathing disease (asthma)  receiving steroids like dexamethasone or prednisone  smoke cigarettes  skin conditions or sensitivity  stomach bleeding  stomach or intestine problems  take drugs that treat or prevent blood clots  an  unusual or allergic reaction to diclofenac, other medicines, foods, dyes, or preservatives  pregnant or trying to get pregnant  breast-feeding How should I use this medicine? This medicine is for external use only. Do not take by mouth. Wash your hands before and after use. If you are treating your hands, only wash your hands before use. Do not get it in your eyes. If you do, rinse your eyes with plenty of cool tap water. Use it as directed on the prescription label at the same time every day. Do not use it more often than directed. Keep taking it unless your health care provider tells you to stop. Apply a thin film of the drug to the affected area. If you are using Voltaren or Venngel One, they come with INSTRUCTIONS FOR USE. Ask your pharmacist for directions on how to use this drug. Read the information carefully. Talk to  your pharmacist or health care provider if you have questions. A special MedGuide will be given to you by the pharmacist with each prescription and refill. Be sure to read this information carefully each time. Talk to your health care provider about the use of this drug in children. Special care may be needed. Overdosage: If you think you have taken too much of this medicine contact a poison control center or emergency room at once. NOTE: This medicine is only for you. Do not share this medicine with others. What if I miss a dose? If you are using Voltaren or Venngel One: If you miss a dose, skip it. Use your next dose at the normal time. Do not use extra or 2 doses at the same time to make up for the missed dose. If you are using Solaraze: If you miss a dose, use it as soon as you can. If it is almost time for your next dose, use only that dose. Do not use double or extra doses. What may interact with this medicine?  aspirin  NSAIDs, medicines for pain and inflammation, like ibuprofen or naproxen Do not use any other skin products without telling your doctor or health care professional. This list may not describe all possible interactions. Give your health care provider a list of all the medicines, herbs, non-prescription drugs, or dietary supplements you use. Also tell them if you smoke, drink alcohol, or use illegal drugs. Some items may interact with your medicine. What should I watch for while using this medicine? Visit your health care provider for regular checks on your progress. Tell your health care provider if your symptoms do not start to get better or if they get worse. Do not take other medicines that contain aspirin, ibuprofen, or naproxen with this medicine. Side effects such as stomach upset, nausea, or ulcers may be more likely to occur. Many non-prescription medicines contain aspirin, ibuprofen, or naproxen. Always read labels carefully. This medicine can cause serious ulcers and  bleeding in the stomach. It can happen with no warning. Smoking, drinking alcohol, older age, and poor health can also increase risks. Call your health care provider right away if you have stomach pain or blood in your vomit or stool. This medicine does not prevent a heart attack or stroke. This medicine may increase the chance of a heart attack or stroke. The chance may increase the longer you use this medicine or if you have heart disease. If you take aspirin to prevent a heart attack or stroke, talk to your health care provider about using this medicine. Alcohol may interfere with  the effect of this medicine. Avoid alcoholic drinks. This medicine may cause serious skin reactions. They can happen weeks to months after starting the medicine. Contact your health care provider right away if you notice fevers or flu-like symptoms with a rash. The rash may be red or purple and then turn into blisters or peeling of the skin. Or, you might notice a red rash with swelling of the face, lips or lymph nodes in your neck or under your arms. Talk to your health care provider if you are pregnant before taking this medicine. Taking this medicine between weeks 20 and 30 of pregnancy may harm your unborn baby. Your health care provider will monitor you closely if you need to take it. After 30 weeks of pregnancy, do not take this medicine. You may get drowsy or dizzy. Do not drive, use machinery, or do anything that needs mental alertness until you know how this medicine affects you. Do not stand up or sit up quickly, especially if you are an older patient. This reduces the risk of dizzy or fainting spells. Be careful brushing or flossing your teeth or using a toothpick because you may get an infection or bleed more easily. If you have any dental work done, tell your dentist you are receiving this medicine. This medicine may make it more difficult to get pregnant. Talk to your health care provider if you are concerned about  your fertility. What side effects may I notice from receiving this medicine? Side effects that you should report to your doctor or health care provider as soon as possible:  allergic reactions (skin rash, itching or hives; swelling of the face, lips, or tongue)  bleeding (bloody or black, tarry stools; red or dark brown urine; spitting up blood or brown material that looks like coffee grounds; red spots on the skin; unusual bruising or bleeding from the eyes, gums, or nose)  blood clot (chest pain; shortness of breath; pain, swelling, or warmth in the leg)  blurred vision OR changes in vision  fast, irregular heartbeat  heart failure (trouble breathing; fast, irregular heartbeat; sudden weight gain; swelling of the ankles, feet, hands; unusually weak or tired)  heart attack (trouble breathing; pain or tightness in the chest, neck, back or arms; unusually weak or tired)  high potassium levels (chest pain; fast, irregular heartbeat; muscle weakness)  kidney injury (trouble passing urine or change in the amount of urine)  liver injury (dark yellow or brown urine; general ill feeling or flu-like symptoms; loss of appetite, right upper belly pain; unusually weak or tired, yellowing of the eyes or skin)  low red blood cell counts (trouble breathing; feeling faint; lightheaded, falls; unusually weak or tired)  palpitations  rash, fever, and swollen lymph nodes  redness, blistering, peeling, or loosening of the skin, including inside the mouth  stroke (changes in vision; confusion; trouble speaking or understanding; severe headaches; sudden numbness or weakness of the face, arm or leg; trouble walking; dizziness; loss of balance or coordination)  trouble breathing Side effects that usually do not require medical attention (report to your doctor or health care provider if they continue or are bothersome):  constipation  diarrhea  dizziness  headache  nausea, vomiting  passing  gas This list may not describe all possible side effects. Call your doctor for medical advice about side effects. You may report side effects to FDA at 1-800-FDA-1088. Where should I keep my medicine? Keep out of the reach of children and pets. Store at room  temperature between 15 and 30 degrees C (59 and 86 degrees F). Do not freeze. Protect from heat. Get rid of any unused medicine after the expiration date. To get rid of medicines that are no longer needed or have expired:  Take the medicine to a medicine take-back program. Check with your pharmacy or law enforcement to find a location.  If you cannot return the medicine, check the label or package insert to see if the medicine should be thrown out in the garbage or flushed down the toilet. If you are not sure, ask your health care provider. If it is safe to put it in the trash, empty the medicine out of the container. Mix the medicine with cat litter, dirt, coffee grounds, or other unwanted substance. Seal the mixture in a bag or container. Put it in the trash. NOTE: This sheet is a summary. It may not cover all possible information. If you have questions about this medicine, talk to your doctor, pharmacist, or health care provider.  2021 Elsevier/Gold Standard (2019-07-20 16:01:32)

## 2020-04-13 NOTE — Assessment & Plan Note (Signed)
Under good control on current regimen. Continue current regimen. Continue to monitor. Call with any concerns. Refills given. Labs drawn today.   

## 2020-04-13 NOTE — Assessment & Plan Note (Signed)
Rechecking labs today. Await results. Treat as needed.  °

## 2020-06-30 DIAGNOSIS — Z0189 Encounter for other specified special examinations: Secondary | ICD-10-CM | POA: Diagnosis not present

## 2020-07-04 DIAGNOSIS — Z79899 Other long term (current) drug therapy: Secondary | ICD-10-CM | POA: Diagnosis not present

## 2020-07-04 DIAGNOSIS — E291 Testicular hypofunction: Secondary | ICD-10-CM | POA: Diagnosis not present

## 2020-07-04 DIAGNOSIS — N5201 Erectile dysfunction due to arterial insufficiency: Secondary | ICD-10-CM | POA: Diagnosis not present

## 2020-07-08 DIAGNOSIS — G4733 Obstructive sleep apnea (adult) (pediatric): Secondary | ICD-10-CM | POA: Diagnosis not present

## 2020-08-05 DIAGNOSIS — R102 Pelvic and perineal pain: Secondary | ICD-10-CM | POA: Diagnosis not present

## 2020-08-05 DIAGNOSIS — N5201 Erectile dysfunction due to arterial insufficiency: Secondary | ICD-10-CM | POA: Diagnosis not present

## 2020-08-05 DIAGNOSIS — N401 Enlarged prostate with lower urinary tract symptoms: Secondary | ICD-10-CM | POA: Diagnosis not present

## 2020-08-05 DIAGNOSIS — Z79899 Other long term (current) drug therapy: Secondary | ICD-10-CM | POA: Diagnosis not present

## 2020-08-05 DIAGNOSIS — E291 Testicular hypofunction: Secondary | ICD-10-CM | POA: Diagnosis not present

## 2020-08-05 DIAGNOSIS — N23 Unspecified renal colic: Secondary | ICD-10-CM | POA: Diagnosis not present

## 2020-08-07 ENCOUNTER — Other Ambulatory Visit (HOSPITAL_COMMUNITY): Payer: Self-pay | Admitting: Urology

## 2020-08-07 ENCOUNTER — Other Ambulatory Visit: Payer: Self-pay | Admitting: Urology

## 2020-08-07 DIAGNOSIS — R319 Hematuria, unspecified: Secondary | ICD-10-CM

## 2020-08-12 ENCOUNTER — Ambulatory Visit: Admission: RE | Admit: 2020-08-12 | Payer: BC Managed Care – PPO | Source: Ambulatory Visit

## 2020-08-12 DIAGNOSIS — M9903 Segmental and somatic dysfunction of lumbar region: Secondary | ICD-10-CM | POA: Diagnosis not present

## 2020-08-12 DIAGNOSIS — M9905 Segmental and somatic dysfunction of pelvic region: Secondary | ICD-10-CM | POA: Diagnosis not present

## 2020-08-12 DIAGNOSIS — M955 Acquired deformity of pelvis: Secondary | ICD-10-CM | POA: Diagnosis not present

## 2020-08-12 DIAGNOSIS — M5441 Lumbago with sciatica, right side: Secondary | ICD-10-CM | POA: Diagnosis not present

## 2020-08-14 DIAGNOSIS — M955 Acquired deformity of pelvis: Secondary | ICD-10-CM | POA: Diagnosis not present

## 2020-08-14 DIAGNOSIS — M9905 Segmental and somatic dysfunction of pelvic region: Secondary | ICD-10-CM | POA: Diagnosis not present

## 2020-08-14 DIAGNOSIS — M5441 Lumbago with sciatica, right side: Secondary | ICD-10-CM | POA: Diagnosis not present

## 2020-08-14 DIAGNOSIS — M9903 Segmental and somatic dysfunction of lumbar region: Secondary | ICD-10-CM | POA: Diagnosis not present

## 2020-08-21 DIAGNOSIS — M955 Acquired deformity of pelvis: Secondary | ICD-10-CM | POA: Diagnosis not present

## 2020-08-21 DIAGNOSIS — M5441 Lumbago with sciatica, right side: Secondary | ICD-10-CM | POA: Diagnosis not present

## 2020-08-21 DIAGNOSIS — M9905 Segmental and somatic dysfunction of pelvic region: Secondary | ICD-10-CM | POA: Diagnosis not present

## 2020-08-21 DIAGNOSIS — M9903 Segmental and somatic dysfunction of lumbar region: Secondary | ICD-10-CM | POA: Diagnosis not present

## 2020-09-02 ENCOUNTER — Ambulatory Visit: Payer: BC Managed Care – PPO

## 2020-09-03 ENCOUNTER — Other Ambulatory Visit: Payer: Self-pay

## 2020-09-03 DIAGNOSIS — I10 Essential (primary) hypertension: Secondary | ICD-10-CM

## 2020-09-04 DIAGNOSIS — M5441 Lumbago with sciatica, right side: Secondary | ICD-10-CM | POA: Diagnosis not present

## 2020-09-04 DIAGNOSIS — M9905 Segmental and somatic dysfunction of pelvic region: Secondary | ICD-10-CM | POA: Diagnosis not present

## 2020-09-04 DIAGNOSIS — M9903 Segmental and somatic dysfunction of lumbar region: Secondary | ICD-10-CM | POA: Diagnosis not present

## 2020-09-04 DIAGNOSIS — M955 Acquired deformity of pelvis: Secondary | ICD-10-CM | POA: Diagnosis not present

## 2020-09-04 NOTE — Telephone Encounter (Signed)
Called patient, no answer left VM to call office back

## 2020-09-04 NOTE — Telephone Encounter (Signed)
6 months given in February- does he have enough to make it to his next appt?

## 2020-09-04 NOTE — Telephone Encounter (Signed)
Pt called back in and confirmed he does have enough of medication to make it to his appt.

## 2020-09-04 NOTE — Telephone Encounter (Signed)
Pt has enough medicine to last until the appt

## 2020-10-02 ENCOUNTER — Other Ambulatory Visit: Payer: Self-pay | Admitting: Family Medicine

## 2020-10-02 DIAGNOSIS — I10 Essential (primary) hypertension: Secondary | ICD-10-CM

## 2020-10-02 DIAGNOSIS — M9903 Segmental and somatic dysfunction of lumbar region: Secondary | ICD-10-CM | POA: Diagnosis not present

## 2020-10-02 DIAGNOSIS — M5441 Lumbago with sciatica, right side: Secondary | ICD-10-CM | POA: Diagnosis not present

## 2020-10-02 DIAGNOSIS — M9905 Segmental and somatic dysfunction of pelvic region: Secondary | ICD-10-CM | POA: Diagnosis not present

## 2020-10-02 DIAGNOSIS — M955 Acquired deformity of pelvis: Secondary | ICD-10-CM | POA: Diagnosis not present

## 2020-10-09 DIAGNOSIS — G4733 Obstructive sleep apnea (adult) (pediatric): Secondary | ICD-10-CM | POA: Diagnosis not present

## 2020-10-10 ENCOUNTER — Ambulatory Visit: Payer: BC Managed Care – PPO | Admitting: Family Medicine

## 2020-10-10 ENCOUNTER — Other Ambulatory Visit: Payer: Self-pay

## 2020-10-10 ENCOUNTER — Encounter: Payer: Self-pay | Admitting: Family Medicine

## 2020-10-10 VITALS — BP 96/65 | HR 57 | Temp 97.7°F | Ht 70.8 in | Wt 248.8 lb

## 2020-10-10 DIAGNOSIS — I10 Essential (primary) hypertension: Secondary | ICD-10-CM | POA: Diagnosis not present

## 2020-10-10 DIAGNOSIS — R7301 Impaired fasting glucose: Secondary | ICD-10-CM

## 2020-10-10 LAB — BAYER DCA HB A1C WAIVED: HB A1C (BAYER DCA - WAIVED): 5.9 % (ref <7.0)

## 2020-10-10 MED ORDER — BENAZEPRIL HCL 20 MG PO TABS: 20.0000 mg | ORAL_TABLET | Freq: Every day | ORAL | 1 refills | Status: DC

## 2020-10-10 NOTE — Assessment & Plan Note (Signed)
BP over treated with addition of flomax- will cut his benzepril to 20mg  and recheck 1 month. Call with any concerns.

## 2020-10-10 NOTE — Progress Notes (Signed)
BP 96/65   Pulse (!) 57   Temp 97.7 F (36.5 C) (Oral)   Ht 5' 10.8" (1.798 m)   Wt 248 lb 12.8 oz (112.9 kg)   SpO2 98%   BMI 34.90 kg/m    Subjective:    Patient ID: Larry Cochran, male    DOB: 12-09-1960, 60 y.o.   MRN: [id number]  HPI: Larry Cochran is a 60 y.o. male  Chief Complaint  Patient presents with   Hypertension    6 month f/up   Was having some back and hip pain. He was seeing urology and started on flomax. Has used a muscle roller and seeing a chiropractor. He is feeling much better now.   HYPERTENSION Hypertension status:  over treated   Satisfied with current treatment? yes Duration of hypertension: chronic BP monitoring frequency:  not checking BP medication side effects:  no Medication compliance: excellent compliance Previous BP meds:benazepril Aspirin: yes Recurrent headaches: no Visual changes: no Palpitations: no Dyspnea: no Chest pain: no Lower extremity edema: no Dizzy/lightheaded: no  Impaired Fasting Glucose HbA1C:  Lab Results  Component Value Date   HGBA1C 6.0 04/09/2020   Duration of elevated blood sugar: chronic Polydipsia: no Polyuria: no Weight change: no Visual disturbance: no Glucose Monitoring: no Diabetic Education: Not Completed Family history of diabetes: yes   Relevant past medical, surgical, family and social history reviewed and updated as indicated. Interim medical history since our last visit reviewed. Allergies and medications reviewed and updated.  Review of Systems  Constitutional: Negative.   Respiratory: Negative.    Cardiovascular: Negative.   Gastrointestinal: Negative.   Musculoskeletal:  Positive for arthralgias and back pain. Negative for gait problem, joint swelling, myalgias, neck pain and neck stiffness.  Skin: Negative.   Psychiatric/Behavioral: Negative.     Per HPI unless specifically indicated above     Objective:    BP 96/65   Pulse (!) 57   Temp 97.7 F (36.5 C) (Oral)    Ht 5' 10.8" (1.798 m)   Wt 248 lb 12.8 oz (112.9 kg)   SpO2 98%   BMI 34.90 kg/m   Wt Readings from Last 3 Encounters:  10/10/20 248 lb 12.8 oz (112.9 kg)  04/11/20 246 lb 12.8 oz (111.9 kg)  08/17/19 225 lb 4 oz (102.2 kg)    Physical Exam Vitals and nursing note reviewed.  Constitutional:      General: He is not in acute distress.    Appearance: Normal appearance. He is not ill-appearing, toxic-appearing or diaphoretic.  HENT:     Head: Normocephalic and atraumatic.     Right Ear: External ear normal.     Left Ear: External ear normal.     Nose: Nose normal.     Mouth/Throat:     Mouth: Mucous membranes are moist.     Pharynx: Oropharynx is clear.  Eyes:     General: No scleral icterus.       Right eye: No discharge.        Left eye: No discharge.     Extraocular Movements: Extraocular movements intact.     Conjunctiva/sclera: Conjunctivae normal.     Pupils: Pupils are equal, round, and reactive to light.  Cardiovascular:     Rate and Rhythm: Normal rate and regular rhythm.     Pulses: Normal pulses.     Heart sounds: Normal heart sounds. No murmur heard.   No friction rub. No gallop.  Pulmonary:     Effort: Pulmonary effort is  normal. No respiratory distress.     Breath sounds: Normal breath sounds. No stridor. No wheezing, rhonchi or rales.  Chest:     Chest wall: No tenderness.  Musculoskeletal:        General: Normal range of motion.     Cervical back: Normal range of motion and neck supple.  Skin:    General: Skin is warm and dry.     Capillary Refill: Capillary refill takes less than 2 seconds.     Coloration: Skin is not jaundiced or pale.     Findings: No bruising, erythema, lesion or rash.  Neurological:     General: No focal deficit present.     Mental Status: He is alert and oriented to person, place, and time. Mental status is at baseline.  Psychiatric:        Mood and Affect: Mood normal.        Behavior: Behavior normal.        Thought Content:  Thought content normal.        Judgment: Judgment normal.    Results for orders placed or performed in visit on 04/11/20  Basic metabolic panel  Result Value Ref Range   Sodium 139 137 - 147  CBC and differential  Result Value Ref Range   Hemoglobin 15.1 13.5 - 17.5   HCT 45 41 - 53   Platelets 257 150 - 399   WBC 6.8   VITAMIN D 25 Hydroxy (Vit-D Deficiency, Fractures)  Result Value Ref Range   Vit D, 25-Hydroxy 41.9   Basic metabolic panel  Result Value Ref Range   Glucose 111    BUN 15 4 - 21   Creatinine 1.0 0.6 - 1.3   Potassium 4.7 3.4 - 5.3   Sodium 139 137 - 147   Chloride 101 99 - 108  Comprehensive metabolic panel  Result Value Ref Range   Globulin 2.2    Calcium 8.9 8.7 - 10.7   Albumin 4.3 3.5 - 5.0  Lipid panel  Result Value Ref Range   Triglycerides 279 (A) 40 - 160   Cholesterol 222 (A) 0 - 200   HDL 34 (A) 35 - 70   LDL Cholesterol 137   Hepatic function panel  Result Value Ref Range   Alkaline Phosphatase 145 (A) 25 - 125   ALT 16 10 - 40   AST 24 14 - 40   Bilirubin, Total 0.4   Hemoglobin A1c  Result Value Ref Range   Hemoglobin A1C 6.0   Testosterone  Result Value Ref Range   Testosterone 370   TSH  Result Value Ref Range   TSH 1.36 0.41 - 5.90      Assessment & Plan:   Problem List Items Addressed This Visit       Cardiovascular and Mediastinum   Hypertension    BP over treated with addition of flomax- will cut his benzepril to 20mg  and recheck 1 month. Call with any concerns.        Relevant Medications   benazepril (LOTENSIN) 20 MG tablet   Other Relevant Orders   Basic metabolic panel     Endocrine   Impaired fasting glucose - Primary    Doing well with a1c of 5.9. Continue diet and exercise. Call with any concerns.        Relevant Orders   Bayer DCA Hb A1c Waived   Other Visit Diagnoses     Essential hypertension       Relevant  Medications   benazepril (LOTENSIN) 20 MG tablet        Follow up plan: Return  1-3 months follow up BP.

## 2020-10-10 NOTE — Assessment & Plan Note (Signed)
Doing well with a1c of 5.9. Continue diet and exercise. Call with any concerns.

## 2020-10-11 LAB — BASIC METABOLIC PANEL
BUN/Creatinine Ratio: 14 (ref 9–20)
BUN: 15 mg/dL (ref 6–24)
CO2: 23 mmol/L (ref 20–29)
Calcium: 8.9 mg/dL (ref 8.7–10.2)
Chloride: 99 mmol/L (ref 96–106)
Creatinine, Ser: 1.05 mg/dL (ref 0.76–1.27)
Glucose: 117 mg/dL — ABNORMAL HIGH (ref 65–99)
Potassium: 4.2 mmol/L (ref 3.5–5.2)
Sodium: 137 mmol/L (ref 134–144)
eGFR: 82 mL/min/{1.73_m2} (ref >59)

## 2020-11-06 DIAGNOSIS — M9903 Segmental and somatic dysfunction of lumbar region: Secondary | ICD-10-CM | POA: Diagnosis not present

## 2020-11-06 DIAGNOSIS — M9905 Segmental and somatic dysfunction of pelvic region: Secondary | ICD-10-CM | POA: Diagnosis not present

## 2020-11-06 DIAGNOSIS — M955 Acquired deformity of pelvis: Secondary | ICD-10-CM | POA: Diagnosis not present

## 2020-11-06 DIAGNOSIS — M5441 Lumbago with sciatica, right side: Secondary | ICD-10-CM | POA: Diagnosis not present

## 2020-11-25 DIAGNOSIS — C44311 Basal cell carcinoma of skin of nose: Secondary | ICD-10-CM | POA: Diagnosis not present

## 2020-11-25 DIAGNOSIS — D2272 Melanocytic nevi of left lower limb, including hip: Secondary | ICD-10-CM | POA: Diagnosis not present

## 2020-11-25 DIAGNOSIS — L400 Psoriasis vulgaris: Secondary | ICD-10-CM | POA: Diagnosis not present

## 2020-11-25 DIAGNOSIS — D225 Melanocytic nevi of trunk: Secondary | ICD-10-CM | POA: Diagnosis not present

## 2020-11-25 DIAGNOSIS — D2239 Melanocytic nevi of other parts of face: Secondary | ICD-10-CM | POA: Diagnosis not present

## 2020-11-25 DIAGNOSIS — D485 Neoplasm of uncertain behavior of skin: Secondary | ICD-10-CM | POA: Diagnosis not present

## 2020-12-02 DIAGNOSIS — J069 Acute upper respiratory infection, unspecified: Secondary | ICD-10-CM | POA: Diagnosis not present

## 2020-12-18 DIAGNOSIS — M955 Acquired deformity of pelvis: Secondary | ICD-10-CM | POA: Diagnosis not present

## 2020-12-18 DIAGNOSIS — M5441 Lumbago with sciatica, right side: Secondary | ICD-10-CM | POA: Diagnosis not present

## 2020-12-18 DIAGNOSIS — M9903 Segmental and somatic dysfunction of lumbar region: Secondary | ICD-10-CM | POA: Diagnosis not present

## 2020-12-18 DIAGNOSIS — M9905 Segmental and somatic dysfunction of pelvic region: Secondary | ICD-10-CM | POA: Diagnosis not present

## 2020-12-26 ENCOUNTER — Other Ambulatory Visit: Payer: Self-pay

## 2020-12-26 ENCOUNTER — Ambulatory Visit: Payer: BC Managed Care – PPO | Admitting: Family Medicine

## 2020-12-26 ENCOUNTER — Encounter: Payer: Self-pay | Admitting: Family Medicine

## 2020-12-26 VITALS — BP 95/64 | HR 61 | Ht 70.0 in | Wt 248.0 lb

## 2020-12-26 DIAGNOSIS — I10 Essential (primary) hypertension: Secondary | ICD-10-CM

## 2020-12-26 NOTE — Progress Notes (Signed)
BP 95/64 Comment: took medication at 630am  Pulse 61   Ht 5\' 10"  (1.778 m)   Wt 248 lb (112.5 kg)   BMI 35.58 kg/m    Subjective:    Patient ID: Larry Cochran, male    DOB: 28-Dec-1960, 60 y.o.   MRN: [id number]  HPI: Larry Cochran is a 60 y.o. male  Chief Complaint  Patient presents with   Hypertension   HYPERTENSION Hypertension status:  overtreated   Satisfied with current treatment? no Duration of hypertension: chronic BP monitoring frequency:  not checking BP medication side effects:  no Medication compliance: excellent compliance Previous BP meds: benazepril Aspirin: no Recurrent headaches: no Visual changes: no Palpitations: yes Dyspnea: no Chest pain: no Lower extremity edema: no Dizzy/lightheaded: no  Relevant past medical, surgical, family and social history reviewed and updated as indicated. Interim medical history since our last visit reviewed. Allergies and medications reviewed and updated.  Review of Systems  Constitutional: Negative.   Respiratory: Negative.    Cardiovascular: Negative.   Gastrointestinal: Negative.   Musculoskeletal: Negative.   Psychiatric/Behavioral: Negative.     Per HPI unless specifically indicated above     Objective:    BP 95/64 Comment: took medication at 630am  Pulse 61   Ht 5\' 10"  (1.778 m)   Wt 248 lb (112.5 kg)   BMI 35.58 kg/m   Wt Readings from Last 3 Encounters:  12/26/20 248 lb (112.5 kg)  10/10/20 248 lb 12.8 oz (112.9 kg)  04/11/20 246 lb 12.8 oz (111.9 kg)    Physical Exam Vitals and nursing note reviewed.  Constitutional:      General: He is not in acute distress.    Appearance: Normal appearance. He is not ill-appearing, toxic-appearing or diaphoretic.  HENT:     Head: Normocephalic and atraumatic.     Right Ear: External ear normal.     Left Ear: External ear normal.     Nose: Nose normal.     Mouth/Throat:     Mouth: Mucous membranes are moist.     Pharynx: Oropharynx is clear.   Eyes:     General: No scleral icterus.       Right eye: No discharge.        Left eye: No discharge.     Extraocular Movements: Extraocular movements intact.     Conjunctiva/sclera: Conjunctivae normal.     Pupils: Pupils are equal, round, and reactive to light.  Cardiovascular:     Rate and Rhythm: Normal rate and regular rhythm.     Pulses: Normal pulses.     Heart sounds: Normal heart sounds. No murmur heard.   No friction rub. No gallop.  Pulmonary:     Effort: Pulmonary effort is normal. No respiratory distress.     Breath sounds: Normal breath sounds. No stridor. No wheezing, rhonchi or rales.  Chest:     Chest wall: No tenderness.  Musculoskeletal:        General: Normal range of motion.     Cervical back: Normal range of motion and neck supple.  Skin:    General: Skin is warm and dry.     Capillary Refill: Capillary refill takes less than 2 seconds.     Coloration: Skin is not jaundiced or pale.     Findings: No bruising, erythema, lesion or rash.  Neurological:     General: No focal deficit present.     Mental Status: He is alert and oriented to person, place, and time. Mental  status is at baseline.  Psychiatric:        Mood and Affect: Mood normal.        Behavior: Behavior normal.        Thought Content: Thought content normal.        Judgment: Judgment normal.    Results for orders placed or performed in visit on 11/23/20  HM COLONOSCOPY  Result Value Ref Range   HM Colonoscopy See Report (in chart) See Report (in chart), Patient Reported      Assessment & Plan:   Problem List Items Addressed This Visit       Cardiovascular and Mediastinum   Hypertension - Primary    BP still running low. Will stop benazepril and recheck at physical. Call if starting to run high. Call with any concerns.         Follow up plan: Return After 04/11/21 for physical.

## 2020-12-26 NOTE — Assessment & Plan Note (Signed)
BP still running low. Will stop benazepril and recheck at physical. Call if starting to run high. Call with any concerns.

## 2021-01-02 DIAGNOSIS — K4090 Unilateral inguinal hernia, without obstruction or gangrene, not specified as recurrent: Secondary | ICD-10-CM | POA: Diagnosis not present

## 2021-01-02 DIAGNOSIS — Z0189 Encounter for other specified special examinations: Secondary | ICD-10-CM | POA: Diagnosis not present

## 2021-01-02 DIAGNOSIS — N401 Enlarged prostate with lower urinary tract symptoms: Secondary | ICD-10-CM | POA: Diagnosis not present

## 2021-01-02 DIAGNOSIS — E291 Testicular hypofunction: Secondary | ICD-10-CM | POA: Diagnosis not present

## 2021-01-02 DIAGNOSIS — Z23 Encounter for immunization: Secondary | ICD-10-CM | POA: Diagnosis not present

## 2021-01-02 DIAGNOSIS — Z79899 Other long term (current) drug therapy: Secondary | ICD-10-CM | POA: Diagnosis not present

## 2021-01-02 DIAGNOSIS — N5201 Erectile dysfunction due to arterial insufficiency: Secondary | ICD-10-CM | POA: Diagnosis not present

## 2021-01-02 LAB — CBC AND DIFFERENTIAL
HCT: 44 (ref 41–53)
Hemoglobin: 14.4 (ref 13.5–17.5)
Neutrophils Absolute: 57
Platelets: 244 (ref 150–399)
WBC: 6.7

## 2021-01-02 LAB — TESTOSTERONE: Testosterone: 206

## 2021-01-02 LAB — HEMOGLOBIN A1C: Hemoglobin A1C: 6.2

## 2021-01-02 LAB — CBC: RBC: 5.01 (ref 3.87–5.11)

## 2021-01-07 DIAGNOSIS — G4733 Obstructive sleep apnea (adult) (pediatric): Secondary | ICD-10-CM | POA: Diagnosis not present

## 2021-01-08 DIAGNOSIS — G4733 Obstructive sleep apnea (adult) (pediatric): Secondary | ICD-10-CM | POA: Diagnosis not present

## 2021-01-15 ENCOUNTER — Telehealth: Payer: Self-pay | Admitting: Family Medicine

## 2021-01-15 MED ORDER — BENAZEPRIL HCL 10 MG PO TABS: 10.0000 mg | ORAL_TABLET | Freq: Every day | ORAL | 2 refills | Status: DC

## 2021-01-15 NOTE — Telephone Encounter (Signed)
Patient notified of Dr. Johnson's message.  °

## 2021-01-15 NOTE — Telephone Encounter (Signed)
Copied from CRM [id number]. Topic: General - Other >> Jan 15, 2021  8:05 AM Jaquita Rector A wrote: Reason for CRM: Patient called in to inform Dr Laural Benes that his blood pressure have been elevating again slowly.  Say that he got home from work on 01/14/21 and felt bad with a headache checked his BP and it was 156/104 he then took a half of his medication that was discontinued and this morning BP was  01/15/21 116/77. Also few days ago  was feeling bad 12/30/20  BP was 131/ 95  and on  12/31/20 128/93. Patient say that he is leaving [place] go [place] mountains today leaving [place] around 10 AM at the latest but would like a call from Dr Laural Benes at   [facility]  [phone]

## 2021-01-15 NOTE — Telephone Encounter (Signed)
OK to go back on 10mg  benazpril. New Rx sent in but OK to take a 1/2 of what he has

## 2021-01-20 DIAGNOSIS — C44311 Basal cell carcinoma of skin of nose: Secondary | ICD-10-CM | POA: Diagnosis not present

## 2021-01-20 DIAGNOSIS — L988 Other specified disorders of the skin and subcutaneous tissue: Secondary | ICD-10-CM | POA: Diagnosis not present

## 2021-01-20 DIAGNOSIS — L814 Other melanin hyperpigmentation: Secondary | ICD-10-CM | POA: Diagnosis not present

## 2021-01-20 DIAGNOSIS — L578 Other skin changes due to chronic exposure to nonionizing radiation: Secondary | ICD-10-CM | POA: Diagnosis not present

## 2021-02-06 DIAGNOSIS — G4733 Obstructive sleep apnea (adult) (pediatric): Secondary | ICD-10-CM | POA: Diagnosis not present

## 2021-02-16 ENCOUNTER — Ambulatory Visit
Admission: RE | Admit: 2021-02-16 | Discharge: 2021-02-16 | Disposition: A | Discharge status: Home / Self Care | Payer: BC Managed Care – PPO | Source: Ambulatory Visit | Attending: Internal Medicine | Admitting: Internal Medicine

## 2021-02-16 ENCOUNTER — Ambulatory Visit
Admission: RE | Admit: 2021-02-16 | Discharge: 2021-02-16 | Disposition: A | Discharge status: Home / Self Care | Payer: BC Managed Care – PPO | Source: Home / Self Care | Attending: Internal Medicine | Admitting: Internal Medicine

## 2021-02-16 ENCOUNTER — Ambulatory Visit: Payer: Self-pay

## 2021-02-16 ENCOUNTER — Ambulatory Visit (INDEPENDENT_AMBULATORY_CARE_PROVIDER_SITE_OTHER): Payer: BC Managed Care – PPO | Admitting: Internal Medicine

## 2021-02-16 ENCOUNTER — Encounter: Payer: Self-pay | Admitting: Internal Medicine

## 2021-02-16 ENCOUNTER — Other Ambulatory Visit: Payer: Self-pay

## 2021-02-16 VITALS — BP 103/67 | HR 58 | Temp 98.1°F | Ht 70.0 in | Wt 254.6 lb

## 2021-02-16 DIAGNOSIS — R1031 Right lower quadrant pain: Secondary | ICD-10-CM | POA: Insufficient documentation

## 2021-02-16 DIAGNOSIS — R109 Unspecified abdominal pain: Secondary | ICD-10-CM | POA: Diagnosis not present

## 2021-02-16 LAB — CBC WITH DIFFERENTIAL/PLATELET
Hematocrit: 43.2 % (ref 37.5–51.0)
Hemoglobin: 14.6 g/dL (ref 13.0–17.7)
Lymphocytes Absolute: 2.1 10*3/uL (ref 0.7–3.1)
Lymphs: 33 %
MCH: 29.1 pg (ref 26.6–33.0)
MCHC: 33.8 g/dL (ref 31.5–35.7)
MCV: 86 fL (ref 79–97)
MID (Absolute): 0.5 10*3/uL (ref 0.1–1.6)
MID: 7 %
Neutrophils Absolute: 3.9 10*3/uL (ref 1.4–7.0)
Neutrophils: 60 %
Platelets: 238 10*3/uL (ref 150–450)
RBC: 5.01 x10E6/uL (ref 4.14–5.80)
RDW: 14 % (ref 11.6–15.4)
WBC: 6.5 10*3/uL (ref 3.4–10.8)

## 2021-02-16 LAB — URINALYSIS, ROUTINE W REFLEX MICROSCOPIC
Bilirubin, UA: NEGATIVE
Glucose, UA: NEGATIVE
Ketones, UA: NEGATIVE
Leukocytes,UA: NEGATIVE
Nitrite, UA: NEGATIVE
Protein,UA: NEGATIVE
Specific Gravity, UA: 1.01 (ref 1.005–1.030)
Urobilinogen, Ur: 0.2 mg/dL (ref 0.2–1.0)
pH, UA: 6 (ref 5.0–7.5)

## 2021-02-16 LAB — MICROSCOPIC EXAMINATION
Bacteria, UA: NONE SEEN
WBC, UA: NONE SEEN /hpf (ref 0–5)

## 2021-02-16 NOTE — Telephone Encounter (Signed)
  Chief Complaint: abdominal pain Symptoms: pt took laxative last week but did not improve pain Frequency: constant Pertinent Negatives: Patient denies back pain, diarrhea Disposition: [] ED /[] Urgent Care (no appt availability in office) / [x] Appointment(In office/virtual)/ []  [facility]/ [] Home Care/ [] Refused Recommended Disposition  Additional Notes: appt is this am with Dr       Reason for Disposition  [1] MILD-MODERATE pain AND [2] constant AND [3] present > 2 hours  Answer Assessment - Initial Assessment Questions 1. LOCATION: "Where does it hurt?"      Right of belly button 2. RADIATION: "Does the pain shoot anywhere else?" (e.g., chest, back)     no 3. ONSET: "When did the pain begin?" (Minutes, hours or days ago)      2 weeks 4. SUDDEN: "Gradual or sudden onset?"   suddenly 5. PATTERN "Does the pain come and go, or is it constant?"    - If constant: "Is it getting better, staying the same, or worsening?"      (Note: Constant means the pain never goes away completely; most serious pain is constant and it progresses)     - If intermittent: "How long does it last?" "Do you have pain now?"     (Note: Intermittent means the pain goes away completely between bouts)     Constant-same 6. SEVERITY: "How bad is the pain?"  (e.g., Scale 1-10; mild, moderate, or severe)    - MILD (1-3): doesn't interfere with normal activities, abdomen soft and not tender to touch     - MODERATE (4-7): interferes with normal activities or awakens from sleep, abdomen tender to touch     - SEVERE (8-10): excruciating pain, doubled over, unable to do any normal activities       moderate 7. RECURRENT SYMPTOM: "Have you ever had this type of stomach pain before?" If Yes, ask: "When was the last time?" and "What happened that time?"      no 8. CAUSE: "What do you think is causing the stomach pain?"     Abd aneurysm, hernia, appendicitis 9. RELIEVING/AGGRAVATING FACTORS: "What makes it  better or worse?" (e.g., movement, antacids, bowel movement)     Better: sitting not moving  worse with activitity, getting in and out of truck or tractor jarring pain shoot to andomen  10. OTHER SYMPTOMS: "Do you have any other symptoms?" (e.g., back pain, diarrhea, fever, urination pain, vomiting)       no  Protocols used: Abdominal Pain - Male-A-AH

## 2021-02-16 NOTE — Progress Notes (Signed)
BP 103/67   Pulse (!) 58   Temp 98.1 F (36.7 C) (Oral)   Ht 5\' 10"  (1.778 m)   Wt 254 lb 9.6 oz (115.5 kg)   SpO2 97%   BMI 36.53 kg/m    Subjective:    Patient ID: Larry Cochran, male    DOB: 28-Mar-1960, 60 y.o.   MRN: [id number]  Chief Complaint  Patient presents with  . 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     HPI: Larry Cochran is a 60 y.o. male  Pt co RLQ pain/ worse when he gets out of his truck / has a ho abdominal aneurysm.  Pt seen urology in the recent past and was ? Diagnosed with ? Beginning of an ?  I( nguinal ) hernia, no notes avaialbel in epic, pt sees Dr. Eliberto Ivory.   Abdominal Pain This is a new (was at work on a monday when this started had a sharp pain in the area RLQ took a laxative helped some. not constipated  is regular now.) problem. The current episode started 1 to 4 weeks ago. The onset quality is sudden. The problem has been waxing and waning. The pain is located in the RLQ. The abdominal pain does not radiate (no bulges when he coughs.). Pertinent negatives include no anorexia, arthralgias, belching, constipation, diarrhea, dysuria, fever, flatus, frequency, headaches, hematochezia, hematuria, melena, myalgias, nausea, vomiting or weight loss. He has tried nothing (being less active / sittig and laying down helps some.) for the symptoms.   Chief Complaint  Patient presents with  . 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     Relevant past medical, surgical, family and social history reviewed and updated as indicated. Interim medical history since our last visit reviewed. Allergies and medications reviewed and updated.  Review of Systems  Constitutional:  Negative for fever and weight loss.   Gastrointestinal:  Positive for abdominal pain. Negative for anorexia, constipation, diarrhea, flatus, hematochezia, melena, nausea and vomiting.  Genitourinary:  Negative for dysuria, frequency and hematuria.  Musculoskeletal:  Negative for arthralgias and myalgias.  Neurological:  Negative for headaches.   Per HPI unless specifically indicated above     Objective:    BP 103/67   Pulse (!) 58   Temp 98.1 F (36.7 C) (Oral)   Ht 5\' 10"  (1.778 m)   Wt 254 lb 9.6 oz (115.5 kg)   SpO2 97%   BMI 36.53 kg/m   Wt Readings from Last 3 Encounters:  02/16/21 254 lb 9.6 oz (115.5 kg)  12/26/20 248 lb (112.5 kg)  10/10/20 248 lb 12.8 oz (112.9 kg)    Physical Exam Vitals and nursing note reviewed.  Constitutional:      General: He is not in acute distress.    Appearance: Normal appearance. He is not ill-appearing or diaphoretic.  HENT:     Head: Normocephalic and atraumatic.     Right Ear: Tympanic membrane and external ear normal. There is no impacted cerumen.     Left Ear: External ear normal.     Nose: No congestion or rhinorrhea.     Mouth/Throat:     Pharynx: No oropharyngeal exudate  or posterior oropharyngeal erythema.  Eyes:     Conjunctiva/sclera: Conjunctivae normal.     Pupils: Pupils are equal, round, and reactive to light.  Cardiovascular:     Rate and Rhythm: Normal rate and regular rhythm.     Heart sounds: No murmur heard.   No friction rub. No gallop.  Pulmonary:     Effort: No respiratory distress.     Breath sounds: No stridor. No wheezing or rhonchi.  Chest:     Chest wall: No tenderness.  Abdominal:     General: Abdomen is flat. Bowel sounds are normal.     Palpations: Abdomen is soft. There is no mass.     Tenderness: There is no abdominal tenderness.  Genitourinary:    Testes:        Right: Mass or tenderness not present.  Musculoskeletal:     Cervical back: Normal range of motion and neck supple. No rigidity or tenderness.     Left lower leg: No  edema.  Skin:    General: Skin is warm and dry.  Neurological:     Mental Status: He is alert.   Results for orders placed or performed in visit on 02/16/21  Microscopic Examination   BLD  Result Value Ref Range   WBC, UA None seen 0 - 5 /hpf   RBC 0-2 0 - 2 /hpf   Epithelial Cells (non renal) 0-10 0 - 10 /hpf   Mucus, UA Present (A) Not Estab.   Bacteria, UA None seen None seen/Few  CBC With Differential/Platelet  Result Value Ref Range   WBC 6.5 3.4 - 10.8 x10E3/uL   RBC 5.01 4.14 - 5.80 x10E6/uL   Hemoglobin 14.6 13.0 - 17.7 g/dL   Hematocrit 62.7 03.5 - 51.0 %   MCV 86 79 - 97 fL   MCH 29.1 26.6 - 33.0 pg   MCHC 33.8 31.5 - 35.7 g/dL   RDW 00.9 38.1 - 82.9 %   Platelets 238 150 - 450 x10E3/uL   Neutrophils 60 Not Estab. %   Lymphs 33 Not Estab. %   MID 7 Not Estab. %   Neutrophils Absolute 3.9 1.4 - 7.0 x10E3/uL   Lymphocytes Absolute 2.1 0.7 - 3.1 x10E3/uL   MID (Absolute) 0.5 0.1 - 1.6 X10E3/uL  Urinalysis, Routine w reflex microscopic  Result Value Ref Range   Specific Gravity, UA 1.010 1.005 - 1.030   pH, UA 6.0 5.0 - 7.5   Color, UA Yellow Yellow   Appearance Ur Clear Clear   Leukocytes,UA Negative Negative   Protein,UA Negative Negative/Trace   Glucose, UA Negative Negative   Ketones, UA Negative Negative   RBC, UA Trace (A) Negative   Bilirubin, UA Negative Negative   Urobilinogen, Ur 0.2 0.2 - 1.0 mg/dL   Nitrite, UA Negative Negative   Microscopic Examination See below:         Current Outpatient Medications:  .  aspirin EC 81 MG tablet, Take 81 mg by mouth daily., Disp: , Rfl:  .  benazepril (LOTENSIN) 10 MG tablet, Take 1 tablet (10 mg total) by mouth daily., Disp: 30 tablet, Rfl: 2 .  calcipotriene-betamethasone (TACLONEX SCALP) external suspension, Apply topically as needed., Disp: 60 g, Rfl: 0 .  clotrimazole-betamethasone (LOTRISONE) cream, SMARTSIG:1 Topical Daily PRN, Disp: , Rfl:  .  ketoconazole (NIZORAL) 2 % shampoo, , Disp: , Rfl:  .   lansoprazole (PREVACID) 15 MG capsule, Take 1 capsule (15 mg total) by mouth daily at 12 noon., Disp: 90  capsule, Rfl: 3 .  sildenafil (REVATIO) 20 MG tablet, Take 20 mg by mouth daily. , Disp: , Rfl:  .  tamsulosin (FLOMAX) 0.4 MG CAPS capsule, Take 0.4 mg by mouth daily., Disp: , Rfl:  .  Testosterone Propionate (FIRST-TESTOSTERONE MC) 2 % CREA, Place 200 mg/mL onto the skin 2 (two) times daily. 20% compounded cream, Disp: , Rfl:  .  Vitamin D, Cholecalciferol, 10 MCG (400 UNIT) TABS, Take 1 Dose by mouth daily., Disp: , Rfl:     Assessment & Plan:  RLQ pain   ? Sec to Nephrolithiasis vs inguinal hernia Will check AXR UA/ CBC today Consider Ct abdomen/ pelvis.  To call urology office for a follow up on ? Hernia which they ahd noticed last visit.  Advised to call the office or go to the ER if he develops worsening abdominal pain /  new onset of bleeding / black stools or  fresh red blood from any orifice  Pt verbalized understanding of such.   Orders Placed This Encounter  Procedures  . Microscopic Examination  . DG Abd 2 Views  . CBC With Differential/Platelet  . Urinalysis, Routine w reflex microscopic  . Urinalysis, Routine w reflex microscopic     No orders of the defined types were placed in this encounter.    Follow up plan: Return in about 10 days (around 02/26/2021).

## 2021-02-17 DIAGNOSIS — R1031 Right lower quadrant pain: Secondary | ICD-10-CM | POA: Insufficient documentation

## 2021-02-17 NOTE — Progress Notes (Signed)
Patient has moderate amount of stool per AXR pl seeif he has symptoms of constipation I see that a CT was ordered per his ? Urologist pl see what they spoke about/ recommend. Thnx.

## 2021-02-26 ENCOUNTER — Ambulatory Visit: Payer: BC Managed Care – PPO | Admitting: Family Medicine

## 2021-03-09 DIAGNOSIS — G4733 Obstructive sleep apnea (adult) (pediatric): Secondary | ICD-10-CM | POA: Diagnosis not present

## 2021-03-27 ENCOUNTER — Encounter: Payer: BC Managed Care – PPO | Admitting: Family Medicine

## 2021-04-02 DIAGNOSIS — H5213 Myopia, bilateral: Secondary | ICD-10-CM | POA: Diagnosis not present

## 2021-04-02 DIAGNOSIS — H43823 Vitreomacular adhesion, bilateral: Secondary | ICD-10-CM | POA: Diagnosis not present

## 2021-04-10 DIAGNOSIS — G4733 Obstructive sleep apnea (adult) (pediatric): Secondary | ICD-10-CM | POA: Diagnosis not present

## 2021-04-17 ENCOUNTER — Encounter: Payer: BC Managed Care – PPO | Admitting: Family Medicine

## 2021-04-28 DIAGNOSIS — Z0189 Encounter for other specified special examinations: Secondary | ICD-10-CM | POA: Diagnosis not present

## 2021-04-28 LAB — HEPATIC FUNCTION PANEL
ALT: 22 (ref 10–40)
AST: 13 — AB (ref 14–40)
Alkaline Phosphatase: 90 (ref 25–125)
Bilirubin, Total: 0.3

## 2021-04-28 LAB — CBC AND DIFFERENTIAL
HCT: 44 (ref 41–53)
Hemoglobin: 15 (ref 13.5–17.5)
Neutrophils Absolute: 4.1
Platelets: 242 (ref 150–399)
WBC: 7.1

## 2021-04-28 LAB — TESTOSTERONE: Testosterone: 328

## 2021-04-28 LAB — COMPREHENSIVE METABOLIC PANEL
Albumin: 4.4 (ref 3.5–5.0)
Calcium: 9.2 (ref 8.7–10.7)
Globulin: 2
eGFR: 77

## 2021-04-28 LAB — BASIC METABOLIC PANEL
BUN: 16 (ref 4–21)
Chloride: 99 (ref 99–108)
Creatinine: 1.1 (ref 0.6–1.3)
Glucose: 116
Potassium: 4.7 (ref 3.4–5.3)
Sodium: 136 — AB (ref 137–147)

## 2021-04-28 LAB — CBC: RBC: 5.21 — AB (ref 3.87–5.11)

## 2021-04-28 LAB — TSH: TSH: 1.77 (ref 0.41–5.90)

## 2021-04-28 LAB — LIPID PANEL
Cholesterol: 223 — AB (ref 0–200)
HDL: 35 (ref 35–70)
LDL Cholesterol: 149
LDl/HDL Ratio: 6.4
Triglycerides: 211 — AB (ref 40–160)

## 2021-04-28 LAB — HEMOGLOBIN A1C: Hemoglobin A1C: 6.2

## 2021-04-28 LAB — VITAMIN D 25 HYDROXY (VIT D DEFICIENCY, FRACTURES): Vit D, 25-Hydroxy: 31.6

## 2021-04-29 DIAGNOSIS — Z713 Dietary counseling and surveillance: Secondary | ICD-10-CM | POA: Diagnosis not present

## 2021-04-29 DIAGNOSIS — Z043 Encounter for examination and observation following other accident: Secondary | ICD-10-CM | POA: Diagnosis not present

## 2021-04-29 DIAGNOSIS — I10 Essential (primary) hypertension: Secondary | ICD-10-CM | POA: Diagnosis not present

## 2021-05-08 ENCOUNTER — Encounter: Payer: Self-pay | Admitting: Family Medicine

## 2021-05-08 ENCOUNTER — Other Ambulatory Visit: Payer: Self-pay

## 2021-05-08 ENCOUNTER — Ambulatory Visit (INDEPENDENT_AMBULATORY_CARE_PROVIDER_SITE_OTHER): Payer: BC Managed Care – PPO | Admitting: Family Medicine

## 2021-05-08 VITALS — BP 112/73 | HR 76 | Temp 98.2°F | Ht 70.0 in | Wt 254.0 lb

## 2021-05-08 DIAGNOSIS — G4733 Obstructive sleep apnea (adult) (pediatric): Secondary | ICD-10-CM | POA: Diagnosis not present

## 2021-05-08 DIAGNOSIS — E291 Testicular hypofunction: Secondary | ICD-10-CM

## 2021-05-08 DIAGNOSIS — I10 Essential (primary) hypertension: Secondary | ICD-10-CM | POA: Diagnosis not present

## 2021-05-08 DIAGNOSIS — Z0000 Encounter for general adult medical examination without abnormal findings: Secondary | ICD-10-CM

## 2021-05-08 DIAGNOSIS — R7301 Impaired fasting glucose: Secondary | ICD-10-CM

## 2021-05-08 LAB — URINALYSIS, ROUTINE W REFLEX MICROSCOPIC
Bilirubin, UA: NEGATIVE
Glucose, UA: NEGATIVE
Ketones, UA: NEGATIVE
Leukocytes,UA: NEGATIVE
Nitrite, UA: NEGATIVE
Protein,UA: NEGATIVE
RBC, UA: NEGATIVE
Specific Gravity, UA: 1.01 (ref 1.005–1.030)
Urobilinogen, Ur: 0.2 mg/dL (ref 0.2–1.0)
pH, UA: 6 (ref 5.0–7.5)

## 2021-05-08 LAB — MICROALBUMIN, URINE WAIVED
Creatinine, Urine Waived: 50 mg/dL (ref 10–300)
Microalb, Ur Waived: 10 mg/L (ref 0–19)
Microalb/Creat Ratio: <30 mg/g (ref <30)

## 2021-05-08 MED ORDER — LANSOPRAZOLE 15 MG PO CPDR
15.0000 mg | DELAYED_RELEASE_CAPSULE | Freq: Every day | ORAL | 3 refills | Status: DC
Start: 2021-05-08 — End: 2022-12-17

## 2021-05-08 MED ORDER — BENAZEPRIL HCL 20 MG PO TABS: 20.0000 mg | ORAL_TABLET | Freq: Every day | ORAL | 1 refills | Status: DC

## 2021-05-08 NOTE — Assessment & Plan Note (Signed)
Continue to follow with urology. Call with any concerns. Continue to monitor.  

## 2021-05-08 NOTE — Progress Notes (Signed)
BP 112/73    Pulse 76    Temp 98.2 F (36.8 C) (Oral)    Ht 5\' 10"  (1.778 m)    Wt 254 lb (115.2 kg)    SpO2 98%    BMI 36.45 kg/m    Subjective:    Patient ID: Larry Cochran, male    DOB: 05/05/1960, 61 y.o.   MRN: [id number]  HPI: Larry Cochran is a 61 y.o. male presenting on 05/08/2021 for comprehensive medical examination. Current medical complaints include:  HYPERTENSION / HYPERLIPIDEMIA Satisfied with current treatment?  yes Duration of hypertension: chronic BP monitoring frequency: not checking BP medication side effects: no Past BP meds: benazepril Duration of hyperlipidemia: years Cholesterol medication side effects: not on anything Cholesterol supplements: none Past cholesterol medications: none Medication compliance: excellent compliance Aspirin: yes Recent stressors: no Recurrent headaches: no Visual changes: no Palpitations: no Dyspnea: no Chest pain: no Lower extremity edema: no Dizzy/lightheaded: no   Interim Problems from his last visit: no  Depression Screen done today and results listed below:  Depression screen [facility] 2/9 05/08/2021 04/11/2020 02/13/2019 12/26/2017 12/01/2016  Decreased Interest 0 0 0 0 0  Down, Depressed, Hopeless 0 0 0 0 0  PHQ - 2 Score 0 0 0 0 0  Altered sleeping 0 - - 0 -  Tired, decreased energy 0 - - 0 -  Change in appetite 0 - - 0 -  Feeling bad or failure about yourself  0 - - 0 -  Trouble concentrating 0 - - 0 -  Moving slowly or fidgety/restless 0 - - 0 -  Suicidal thoughts 0 - - 0 -  PHQ-9 Score 0 - - 0 -  Difficult doing work/chores Not difficult at all - - - -    Past Medical History:  Past Medical History:  Diagnosis Date   Hypertension    Low back pain     Surgical History:  Past Surgical History:  Procedure Laterality Date   MOHS SURGERY     SKIN BIOPSY     SKIN LESION EXCISION      Medications:  Current Outpatient Medications on File Prior to Visit  Medication Sig   aspirin EC 81 MG tablet Take 81 mg  by mouth daily.   calcipotriene-betamethasone (TACLONEX SCALP) external suspension Apply topically as needed.   clotrimazole-betamethasone (LOTRISONE) cream SMARTSIG:1 Topical Daily PRN   ketoconazole (NIZORAL) 2 % shampoo    sildenafil (REVATIO) 20 MG tablet Take 20 mg by mouth daily.    tamsulosin (FLOMAX) 0.4 MG CAPS capsule Take 0.4 mg by mouth daily.   Testosterone Propionate (FIRST-TESTOSTERONE MC) 2 % CREA Place 200 mg/mL onto the skin 2 (two) times daily. 20% compounded cream   Vitamin D, Cholecalciferol, 10 MCG (400 UNIT) TABS Take 1 Dose by mouth daily.   No current facility-administered medications on file prior to visit.    Allergies:  No Known Allergies  Social History:  Social History   Socioeconomic History   Marital status: Single    Spouse name: Not on file   Number of children: Not on file   Years of education: Not on file   Highest education level: Not on file  Occupational History   Not on file  Tobacco Use   Smoking status: Former    Types: Cigars    Quit date: 10/28/2009    Years since quitting: 11.5   Smokeless tobacco: Never  Vaping Use   Vaping Use: Never used  Substance and Sexual Activity  Alcohol use: Yes    Alcohol/week: 0.0 standard drinks    Comment: social   Drug use: No   Sexual activity: Yes  Other Topics Concern   Not on file  Social History Narrative   Not on file   Social Determinants of Health   Financial Resource Strain: Not on file  Food Insecurity: Not on file  Transportation Needs: Not on file  Physical Activity: Not on file  Stress: Not on file  Social Connections: Not on file  Intimate Partner Violence: Not on file   Social History   Tobacco Use  Smoking Status Former   Types: Cigars   Quit date: 10/28/2009   Years since quitting: 11.5  Smokeless Tobacco Never   Social History   Substance and Sexual Activity  Alcohol Use Yes   Alcohol/week: 0.0 standard drinks   Comment: social    Family History:   Family History  Problem Relation Age of Onset   AAA (abdominal aortic aneurysm) Mother    Diabetes Father    Bladder Cancer Brother    Benign prostatic hyperplasia Brother    Diabetes Paternal Grandmother    Heart disease Paternal Grandmother    Diabetes Paternal Grandfather     Past medical history, surgical history, medications, allergies, family history and social history reviewed with patient today and changes made to appropriate areas of the chart.   Review of Systems  Constitutional: Negative.   HENT: Negative.    Eyes: Negative.   Respiratory: Negative.    Cardiovascular: Negative.   Gastrointestinal: Negative.   Genitourinary: Negative.   Musculoskeletal: Negative.   Skin: Negative.   Neurological: Negative.   Endo/Heme/Allergies: Negative.   Psychiatric/Behavioral: Negative.    All other ROS negative except what is listed above and in the HPI.      Objective:    BP 112/73    Pulse 76    Temp 98.2 F (36.8 C) (Oral)    Ht 5\' 10"  (1.778 m)    Wt 254 lb (115.2 kg)    SpO2 98%    BMI 36.45 kg/m   Wt Readings from Last 3 Encounters:  05/08/21 254 lb (115.2 kg)  02/16/21 254 lb 9.6 oz (115.5 kg)  12/26/20 248 lb (112.5 kg)    Physical Exam Vitals and nursing note reviewed.  Constitutional:      General: He is not in acute distress.    Appearance: Normal appearance. He is obese. He is not ill-appearing, toxic-appearing or diaphoretic.  HENT:     Head: Normocephalic and atraumatic.     Right Ear: Tympanic membrane, ear canal and external ear normal. There is no impacted cerumen.     Left Ear: Tympanic membrane, ear canal and external ear normal. There is no impacted cerumen.     Nose: Nose normal. No congestion or rhinorrhea.     Mouth/Throat:     Mouth: Mucous membranes are moist.     Pharynx: Oropharynx is clear. No oropharyngeal exudate or posterior oropharyngeal erythema.  Eyes:     General: No scleral icterus.       Right eye: No discharge.        Left  eye: No discharge.     Extraocular Movements: Extraocular movements intact.     Conjunctiva/sclera: Conjunctivae normal.     Pupils: Pupils are equal, round, and reactive to light.  Neck:     Vascular: No carotid bruit.  Cardiovascular:     Rate and Rhythm: Normal rate and regular rhythm.  Pulses: Normal pulses.     Heart sounds: Normal heart sounds. No murmur heard.   No friction rub. No gallop.  Pulmonary:     Effort: Pulmonary effort is normal. No respiratory distress.     Breath sounds: Normal breath sounds. No stridor. No wheezing, rhonchi or rales.  Chest:     Chest wall: No tenderness.  Abdominal:     General: Abdomen is flat. Bowel sounds are normal. There is no distension.     Palpations: Abdomen is soft. There is no mass.     Tenderness: There is no abdominal tenderness. There is no right CVA tenderness, left CVA tenderness, guarding or rebound.     Hernia: No hernia is present.  Genitourinary:    Comments: Genital exam deferred with shared decision making Musculoskeletal:        General: No swelling, tenderness, deformity or signs of injury.     Cervical back: Normal range of motion and neck supple. No rigidity. No muscular tenderness.     Right lower leg: No edema.     Left lower leg: No edema.  Lymphadenopathy:     Cervical: No cervical adenopathy.  Skin:    General: Skin is warm and dry.     Capillary Refill: Capillary refill takes less than 2 seconds.     Coloration: Skin is not jaundiced or pale.     Findings: No bruising, erythema, lesion or rash.  Neurological:     General: No focal deficit present.     Mental Status: He is alert and oriented to person, place, and time.     Cranial Nerves: No cranial nerve deficit.     Sensory: No sensory deficit.     Motor: No weakness.     Coordination: Coordination normal.     Gait: Gait normal.     Deep Tendon Reflexes: Reflexes normal.  Psychiatric:        Mood and Affect: Mood normal.        Behavior: Behavior  normal.        Thought Content: Thought content normal.        Judgment: Judgment normal.    Results for orders placed or performed in visit on 05/08/21  Urinalysis, Routine w reflex microscopic  Result Value Ref Range   Specific Gravity, UA 1.010 1.005 - 1.030   pH, UA 6.0 5.0 - 7.5   Color, UA Yellow Yellow   Appearance Ur Clear Clear   Leukocytes,UA Negative Negative   Protein,UA Negative Negative/Trace   Glucose, UA Negative Negative   Ketones, UA Negative Negative   RBC, UA Negative Negative   Bilirubin, UA Negative Negative   Urobilinogen, Ur 0.2 0.2 - 1.0 mg/dL   Nitrite, UA Negative Negative  Microalbumin, Urine Waived  Result Value Ref Range   Microalb, Ur Waived 10 0 - 19 mg/L   Creatinine, Urine Waived 50 10 - 300 mg/dL   Microalb/Creat Ratio <30 <30 mg/g      Assessment & Plan:   Problem List Items Addressed This Visit       Cardiovascular and Mediastinum   Hypertension    Under good control on current regimen. Continue current regimen. Continue to monitor. Call with any concerns. Refills given. Labs drawn today.       Relevant Medications   benazepril (LOTENSIN) 20 MG tablet   Other Relevant Orders   Microalbumin, Urine Waived (Completed)     Endocrine   Hypogonadism in male    Continue to follow with  urology. Call with any concerns. Continue to monitor.       Impaired fasting glucose    Stable with A1c of 6.2. Continue diet and exercise. Call with any concerns. Continue to monitor.       Relevant Orders   Microalbumin, Urine Waived (Completed)   Other Visit Diagnoses     Routine general medical examination at a health care facility    -  Primary   Vaccines up to date. Screening labs checked today. Colonoscopy up to date. Continue diet and exercise. Continue to monitor.    Relevant Orders   Urinalysis, Routine w reflex microscopic (Completed)   Microalbumin, Urine Waived (Completed)        Discussed aspirin prophylaxis for myocardial  infarction prevention and decision was made to continue ASA  LABORATORY TESTING:  Health maintenance labs ordered today as discussed above.   The natural history of prostate cancer and ongoing controversy regarding screening and potential treatment outcomes of prostate cancer has been discussed with the patient. The meaning of a false positive PSA and a false negative PSA has been discussed. He indicates understanding of the limitations of this screening test and wishes to proceed with screening PSA testing.   IMMUNIZATIONS:   - Tdap: Tetanus vaccination status reviewed: last tetanus booster within 10 years. - Influenza: Up to date - Pneumovax: Not applicable - Prevnar: Not applicable - COVID: Up to date - HPV: Not applicable - Shingrix vaccine: Refused  SCREENING: - Colonoscopy: Up to date  Discussed with patient purpose of the colonoscopy is to detect colon cancer at curable precancerous or early stages   PATIENT COUNSELING:    Sexuality: Discussed sexually transmitted diseases, partner selection, use of condoms, avoidance of unintended pregnancy  and contraceptive alternatives.   Advised to avoid cigarette smoking.  I discussed with the patient that most people either abstain from alcohol or drink within safe limits (<=14/week and <=4 drinks/occasion for males, <=7/weeks and <= 3 drinks/occasion for females) and that the risk for alcohol disorders and other health effects rises proportionally with the number of drinks per week and how often a drinker exceeds daily limits.  Discussed cessation/primary prevention of drug use and availability of treatment for abuse.   Diet: Encouraged to adjust caloric intake to maintain  or achieve ideal body weight, to reduce intake of dietary saturated fat and total fat, to limit sodium intake by avoiding high sodium foods and not adding table salt, and to maintain adequate dietary potassium and calcium preferably from fresh fruits, vegetables, and  low-fat dairy products.    stressed the importance of regular exercise  Injury prevention: Discussed safety belts, safety helmets, smoke detector, smoking near bedding or upholstery.   Dental health: Discussed importance of regular tooth brushing, flossing, and dental visits.   Follow up plan: NEXT PREVENTATIVE PHYSICAL DUE IN 1 YEAR. Return in about 6 months (around 11/08/2021).

## 2021-05-08 NOTE — Assessment & Plan Note (Signed)
Stable with A1c of 6.2. Continue diet and exercise. Call with any concerns. Continue to monitor.  ?

## 2021-05-08 NOTE — Assessment & Plan Note (Signed)
Under good control on current regimen. Continue current regimen. Continue to monitor. Call with any concerns. Refills given. Labs drawn today.   

## 2021-05-29 DIAGNOSIS — L218 Other seborrheic dermatitis: Secondary | ICD-10-CM | POA: Diagnosis not present

## 2021-05-29 DIAGNOSIS — Z85828 Personal history of other malignant neoplasm of skin: Secondary | ICD-10-CM | POA: Diagnosis not present

## 2021-05-29 DIAGNOSIS — L400 Psoriasis vulgaris: Secondary | ICD-10-CM | POA: Diagnosis not present

## 2021-05-29 DIAGNOSIS — D2261 Melanocytic nevi of right upper limb, including shoulder: Secondary | ICD-10-CM | POA: Diagnosis not present

## 2021-06-08 DIAGNOSIS — G4733 Obstructive sleep apnea (adult) (pediatric): Secondary | ICD-10-CM | POA: Diagnosis not present

## 2021-06-26 DIAGNOSIS — E291 Testicular hypofunction: Secondary | ICD-10-CM | POA: Diagnosis not present

## 2021-06-26 DIAGNOSIS — N401 Enlarged prostate with lower urinary tract symptoms: Secondary | ICD-10-CM | POA: Diagnosis not present

## 2021-06-26 DIAGNOSIS — N5201 Erectile dysfunction due to arterial insufficiency: Secondary | ICD-10-CM | POA: Diagnosis not present

## 2021-06-26 DIAGNOSIS — Z79899 Other long term (current) drug therapy: Secondary | ICD-10-CM | POA: Diagnosis not present

## 2021-07-10 DIAGNOSIS — G4733 Obstructive sleep apnea (adult) (pediatric): Secondary | ICD-10-CM | POA: Diagnosis not present

## 2021-08-04 ENCOUNTER — Other Ambulatory Visit: Payer: Self-pay | Admitting: Urology

## 2021-08-04 DIAGNOSIS — R319 Hematuria, unspecified: Secondary | ICD-10-CM

## 2021-08-10 DIAGNOSIS — G4733 Obstructive sleep apnea (adult) (pediatric): Secondary | ICD-10-CM | POA: Diagnosis not present

## 2021-09-09 DIAGNOSIS — G4733 Obstructive sleep apnea (adult) (pediatric): Secondary | ICD-10-CM | POA: Diagnosis not present

## 2021-10-10 DIAGNOSIS — G4733 Obstructive sleep apnea (adult) (pediatric): Secondary | ICD-10-CM | POA: Diagnosis not present

## 2021-10-29 DIAGNOSIS — D2339 Other benign neoplasm of skin of other parts of face: Secondary | ICD-10-CM | POA: Diagnosis not present

## 2021-10-29 DIAGNOSIS — D485 Neoplasm of uncertain behavior of skin: Secondary | ICD-10-CM | POA: Diagnosis not present

## 2021-11-08 ENCOUNTER — Other Ambulatory Visit: Payer: Self-pay | Admitting: Family Medicine

## 2021-11-10 DIAGNOSIS — G4733 Obstructive sleep apnea (adult) (pediatric): Secondary | ICD-10-CM | POA: Diagnosis not present

## 2021-11-10 NOTE — Telephone Encounter (Signed)
Requested Prescriptions  Pending Prescriptions Disp Refills  . benazepril (LOTENSIN) 20 MG tablet [Pharmacy Med Name: BENAZEPRIL HCL 20 MG TABLET] 90 tablet 0    Sig: TAKE 1 TABLET BY MOUTH EVERY DAY     Cardiovascular:  ACE Inhibitors Failed - 11/08/2021  9:36 AM      Failed - Cr in normal range and within 180 days    Creatinine  Date Value Ref Range Status  04/28/2021 1.1 0.6 - 1.3 Final   Creatinine, Ser  Date Value Ref Range Status  10/10/2020 1.05 0.76 - 1.27 mg/dL Final         Failed - K in normal range and within 180 days    Potassium  Date Value Ref Range Status  04/28/2021 4.7 3.4 - 5.3 Final         Failed - Valid encounter within last 6 months    Recent Outpatient Visits          6 months ago Routine general medical examination at a health care facility   [facility], Megan P, DO   8 months ago RLQ abdominal pain   [facility] [place], Avanti, MD   10 months ago Primary hypertension   [facility] [place], [place], DO   1 year ago Impaired fasting glucose   [facility] [place], Megan P, DO   1 year ago Routine general medical examination at a health care facility   [facility] [place], [place], DO      Future Appointments            In 3 days Dorcas Carrow, DO [facility], [facility]           Passed - Patient is not pregnant      Passed - Last BP in normal range    BP Readings from Last 1 Encounters:  05/08/21 112/73

## 2021-11-13 ENCOUNTER — Encounter: Payer: Self-pay | Admitting: Family Medicine

## 2021-11-13 ENCOUNTER — Ambulatory Visit: Payer: BC Managed Care – PPO | Admitting: Family Medicine

## 2021-11-13 VITALS — BP 108/73 | HR 68 | Temp 97.9°F | Ht 70.0 in | Wt 245.2 lb

## 2021-11-13 DIAGNOSIS — I10 Essential (primary) hypertension: Secondary | ICD-10-CM | POA: Diagnosis not present

## 2021-11-13 DIAGNOSIS — E291 Testicular hypofunction: Secondary | ICD-10-CM | POA: Diagnosis not present

## 2021-11-13 DIAGNOSIS — Z23 Encounter for immunization: Secondary | ICD-10-CM | POA: Diagnosis not present

## 2021-11-13 DIAGNOSIS — R7301 Impaired fasting glucose: Secondary | ICD-10-CM | POA: Diagnosis not present

## 2021-11-13 LAB — BAYER DCA HB A1C WAIVED: HB A1C (BAYER DCA - WAIVED): 6.3 % — ABNORMAL HIGH (ref 4.8–5.6)

## 2021-11-13 MED ORDER — BENAZEPRIL HCL 20 MG PO TABS: 20.0000 mg | ORAL_TABLET | Freq: Every day | ORAL | 1 refills | Status: DC

## 2021-11-13 NOTE — Addendum Note (Signed)
Addended by: Dorcas Carrow on: 11/13/2021 09:20 AM   Modules accepted: Orders

## 2021-11-13 NOTE — Assessment & Plan Note (Signed)
Under good control on current regimen. Continue current regimen. Continue to monitor. Call with any concerns. Refills given. Labs drawn today.   

## 2021-11-13 NOTE — Assessment & Plan Note (Signed)
Doing well with a1c of 6.3. Continue diet and exercise. Call with any concerns. Continue to monitor.

## 2021-11-13 NOTE — Progress Notes (Signed)
BP 108/73   Pulse 68   Temp 97.9 F (36.6 C)   Ht _0  (1.778 m)   Wt 245 lb 3.2 oz (111.2 kg)   SpO2 97%   BMI 35.18 kg/m    Subjective:    Patient ID: Larry Cochran, male    DOB: Jan 14, 1961, 61 y.o.   MRN: [id number]  HPI: Larry Cochran is a 60 y.o. male  Chief Complaint  Patient presents with   Hypertension   IFG   Impaired Fasting Glucose HbA1C:  Lab Results  Component Value Date   HGBA1C 6.2 04/28/2021   Duration of elevated blood sugar: chronic Polydipsia: no Polyuria: no Weight change: no Visual disturbance: no Glucose Monitoring: no    Accucheck frequency: Not Checking Diabetic Education: Not Completed Family history of diabetes: yes  HYPERTENSION  Hypertension status: controlled  Satisfied with current treatment? yes Duration of hypertension: chronic BP monitoring frequency:  not checking BP medication side effects:  no Medication compliance: excellent compliance Previous BP meds: benazepril Aspirin: no Recurrent headaches: no Visual changes: no Palpitations: no Dyspnea: no Chest pain: no Lower extremity edema: no Dizzy/lightheaded: no  LOW TESTOSTERONE Duration: chronic Status: controlled  Satisfied with current treatment:  yes Previous testosterone therapies: testosterone cream Medication side effects:  no Medication compliance: excellent compliance Decreased libido: no Fatigue: no Depressed mood: no Muscle weakness: no Erectile dysfunction: no   Relevant past medical, surgical, family and social history reviewed and updated as indicated. Interim medical history since our last visit reviewed. Allergies and medications reviewed and updated.  Review of Systems  Constitutional: Negative.   Respiratory: Negative.    Cardiovascular: Negative.   Gastrointestinal: Negative.   Musculoskeletal: Negative.   Neurological: Negative.   Psychiatric/Behavioral: Negative.      Per HPI unless specifically indicated above      Objective:    BP 108/73   Pulse 68   Temp 97.9 F (36.6 C)   Ht _1  (1.778 m)   Wt 245 lb 3.2 oz (111.2 kg)   SpO2 97%   BMI 35.18 kg/m   Wt Readings from Last 3 Encounters:  11/13/21 245 lb 3.2 oz (111.2 kg)  05/08/21 254 lb (115.2 kg)  02/16/21 254 lb 9.6 oz (115.5 kg)    Physical Exam Vitals and nursing note reviewed.  Constitutional:      General: He is not in acute distress.    Appearance: Normal appearance. He is obese. He is not ill-appearing, toxic-appearing or diaphoretic.  HENT:     Head: Normocephalic and atraumatic.     Right Ear: External ear normal.     Left Ear: External ear normal.     Nose: Nose normal.     Mouth/Throat:     Mouth: Mucous membranes are moist.     Pharynx: Oropharynx is clear.  Eyes:     General: No scleral icterus.       Right eye: No discharge.        Left eye: No discharge.     Extraocular Movements: Extraocular movements intact.     Conjunctiva/sclera: Conjunctivae normal.     Pupils: Pupils are equal, round, and reactive to light.  Cardiovascular:     Rate and Rhythm: Normal rate and regular rhythm.     Pulses: Normal pulses.     Heart sounds: Normal heart sounds. No murmur heard.    No friction rub. No gallop.  Pulmonary:     Effort: Pulmonary effort is normal. No respiratory distress.  Breath sounds: Normal breath sounds. No stridor. No wheezing, rhonchi or rales.  Chest:     Chest wall: No tenderness.  Musculoskeletal:        General: Normal range of motion.     Cervical back: Normal range of motion and neck supple.  Skin:    General: Skin is warm and dry.     Capillary Refill: Capillary refill takes less than 2 seconds.     Coloration: Skin is not jaundiced or pale.     Findings: No bruising, erythema, lesion or rash.  Neurological:     General: No focal deficit present.     Mental Status: He is alert and oriented to person, place, and time. Mental status is at baseline.  Psychiatric:        Mood and Affect:  Mood normal.        Behavior: Behavior normal.        Thought Content: Thought content normal.        Judgment: Judgment normal.     Results for orders placed or performed in visit on 05/08/21  Urinalysis, Routine w reflex microscopic  Result Value Ref Range   Specific Gravity, UA 1.010 1.005 - 1.030   pH, UA 6.0 5.0 - 7.5   Color, UA Yellow Yellow   Appearance Ur Clear Clear   Leukocytes,UA Negative Negative   Protein,UA Negative Negative/Trace   Glucose, UA Negative Negative   Ketones, UA Negative Negative   RBC, UA Negative Negative   Bilirubin, UA Negative Negative   Urobilinogen, Ur 0.2 0.2 - 1.0 mg/dL   Nitrite, UA Negative Negative  Microalbumin, Urine Waived  Result Value Ref Range   Microalb, Ur Waived 10 0 - 19 mg/L   Creatinine, Urine Waived 50 10 - 300 mg/dL   Microalb/Creat Ratio <30 <30 mg/g  CBC and differential  Result Value Ref Range   Hemoglobin 15.0 13.5 - 17.5   HCT 44 41 - 53   Neutrophils Absolute 4.10    Platelets 242 150 - 399   WBC 7.1   CBC  Result Value Ref Range   RBC 5.21 (A) 3.87 - 5.11  VITAMIN D 25 Hydroxy (Vit-D Deficiency, Fractures)  Result Value Ref Range   Vit D, 25-Hydroxy 16.0   Basic metabolic panel  Result Value Ref Range   Glucose 116    BUN 16 4 - 21   Creatinine 1.1 0.6 - 1.3   Potassium 4.7 3.4 - 5.3   Sodium 136 (A) 137 - 147   Chloride 99 99 - 108  Comprehensive metabolic panel  Result Value Ref Range   Globulin 2.0    eGFR 77    Calcium 9.2 8.7 - 10.7   Albumin 4.4 3.5 - 5.0  Lipid panel  Result Value Ref Range   LDl/HDL Ratio 6.4    Triglycerides 211 (A) 40 - 160   Cholesterol 223 (A) 0 - 200   HDL 35 35 - 70   LDL Cholesterol 149   Hepatic function panel  Result Value Ref Range   Alkaline Phosphatase 90 25 - 125   ALT 22 10 - 40   AST 13 (A) 14 - 40   Bilirubin, Total 0.3   Hemoglobin A1c  Result Value Ref Range   Hemoglobin A1C 6.2   Testosterone  Result Value Ref Range   Testosterone 328   TSH   Result Value Ref Range   TSH 1.77 0.41 - 5.90      Assessment &  Plan:   Problem List Items Addressed This Visit       Cardiovascular and Mediastinum   Hypertension - Primary    Under good control on current regimen. Continue current regimen. Continue to monitor. Call with any concerns. Refills given. Labs drawn today.        Relevant Orders   Comprehensive metabolic panel   CBC with Differential/Platelet     Endocrine   Hypogonadism in male    Rechecking labs today. Continue to follow with urology. Call with any concerns.       Relevant Orders   CBC with Differential/Platelet   Testosterone, free, total([facility])   Impaired fasting glucose    Doing well with a1c of 6.3. Continue diet and exercise. Call with any concerns. Continue to monitor.       Relevant Orders   CBC with Differential/Platelet   Bayer DCA Hb A1c Waived   Other Visit Diagnoses     Need for influenza vaccination       Relevant Orders   Flu Vaccine QUAD 6+ mos PF IM (Fluarix Quad PF) (Completed)        Follow up plan: Return in about 6 months (around 05/14/2022) for physcial .

## 2021-11-13 NOTE — Assessment & Plan Note (Signed)
Rechecking labs today. Continue to follow with urology. Call with any concerns.

## 2021-11-21 LAB — COMPREHENSIVE METABOLIC PANEL
ALT: 26 IU/L (ref 0–44)
AST: 15 IU/L (ref 0–40)
Albumin/Globulin Ratio: 2.2 (ref 1.2–2.2)
Albumin: 4.6 g/dL (ref 3.8–4.9)
Alkaline Phosphatase: 92 IU/L (ref 44–121)
BUN/Creatinine Ratio: 10 (ref 9–20)
BUN: 10 mg/dL (ref 6–24)
Bilirubin Total: 0.3 mg/dL (ref 0.0–1.2)
CO2: 23 mmol/L (ref 20–29)
Calcium: 9.3 mg/dL (ref 8.7–10.2)
Chloride: 98 mmol/L (ref 96–106)
Creatinine, Ser: 1.04 mg/dL (ref 0.76–1.27)
Globulin, Total: 2.1 g/dL (ref 1.5–4.5)
Glucose: 120 mg/dL — ABNORMAL HIGH (ref 70–99)
Potassium: 4.6 mmol/L (ref 3.5–5.2)
Sodium: 136 mmol/L (ref 134–144)
Total Protein: 6.7 g/dL (ref 6.0–8.5)
eGFR: 83 mL/min/{1.73_m2} (ref >59)

## 2021-11-21 LAB — CBC WITH DIFFERENTIAL/PLATELET
Basophils Absolute: 0 10*3/uL (ref 0.0–0.2)
Basos: 1 %
EOS (ABSOLUTE): 0.1 10*3/uL (ref 0.0–0.4)
Eos: 2 %
Hematocrit: 45.8 % (ref 37.5–51.0)
Hemoglobin: 15.3 g/dL (ref 13.0–17.7)
Immature Grans (Abs): 0 10*3/uL (ref 0.0–0.1)
Immature Granulocytes: 1 %
Lymphocytes Absolute: 2.1 10*3/uL (ref 0.7–3.1)
Lymphs: 36 %
MCH: 29.1 pg (ref 26.6–33.0)
MCHC: 33.4 g/dL (ref 31.5–35.7)
MCV: 87 fL (ref 79–97)
Monocytes Absolute: 0.3 10*3/uL (ref 0.1–0.9)
Monocytes: 5 %
Neutrophils Absolute: 3.3 10*3/uL (ref 1.4–7.0)
Neutrophils: 55 %
Platelets: 230 10*3/uL (ref 150–450)
RBC: 5.25 x10E6/uL (ref 4.14–5.80)
RDW: 13.8 % (ref 11.6–15.4)
WBC: 6 10*3/uL (ref 3.4–10.8)

## 2021-11-21 LAB — TESTOSTERONE, FREE, TOTAL, SHBG
Sex Hormone Binding: 25.1 nmol/L (ref 19.3–76.4)
Testosterone, Free: 8.3 pg/mL (ref 7.2–24.0)
Testosterone: 407 ng/dL (ref 264–916)

## 2021-12-10 DIAGNOSIS — G4733 Obstructive sleep apnea (adult) (pediatric): Secondary | ICD-10-CM | POA: Diagnosis not present

## 2021-12-11 DIAGNOSIS — D2272 Melanocytic nevi of left lower limb, including hip: Secondary | ICD-10-CM | POA: Diagnosis not present

## 2021-12-11 DIAGNOSIS — D2261 Melanocytic nevi of right upper limb, including shoulder: Secondary | ICD-10-CM | POA: Diagnosis not present

## 2021-12-11 DIAGNOSIS — L570 Actinic keratosis: Secondary | ICD-10-CM | POA: Diagnosis not present

## 2021-12-11 DIAGNOSIS — Z85828 Personal history of other malignant neoplasm of skin: Secondary | ICD-10-CM | POA: Diagnosis not present

## 2021-12-11 DIAGNOSIS — L400 Psoriasis vulgaris: Secondary | ICD-10-CM | POA: Diagnosis not present

## 2022-01-11 DIAGNOSIS — G4733 Obstructive sleep apnea (adult) (pediatric): Secondary | ICD-10-CM | POA: Diagnosis not present

## 2022-01-15 DIAGNOSIS — Z79899 Other long term (current) drug therapy: Secondary | ICD-10-CM | POA: Diagnosis not present

## 2022-01-15 DIAGNOSIS — N5201 Erectile dysfunction due to arterial insufficiency: Secondary | ICD-10-CM | POA: Diagnosis not present

## 2022-01-15 DIAGNOSIS — N401 Enlarged prostate with lower urinary tract symptoms: Secondary | ICD-10-CM | POA: Diagnosis not present

## 2022-01-15 DIAGNOSIS — E291 Testicular hypofunction: Secondary | ICD-10-CM | POA: Diagnosis not present

## 2022-01-25 DIAGNOSIS — Z0189 Encounter for other specified special examinations: Secondary | ICD-10-CM | POA: Diagnosis not present

## 2022-01-25 DIAGNOSIS — J069 Acute upper respiratory infection, unspecified: Secondary | ICD-10-CM | POA: Diagnosis not present

## 2022-02-10 DIAGNOSIS — G4733 Obstructive sleep apnea (adult) (pediatric): Secondary | ICD-10-CM | POA: Diagnosis not present

## 2022-03-04 ENCOUNTER — Encounter: Payer: Self-pay | Admitting: Family Medicine

## 2022-03-13 DIAGNOSIS — G4733 Obstructive sleep apnea (adult) (pediatric): Secondary | ICD-10-CM | POA: Diagnosis not present

## 2022-04-09 LAB — COMPREHENSIVE METABOLIC PANEL
Albumin: 4.5 (ref 3.5–5.0)
Calcium: 9.3 (ref 8.7–10.7)
Globulin: 2.1

## 2022-04-09 LAB — HEPATIC FUNCTION PANEL
ALT: 18 U/L (ref 10–40)
AST: 25 (ref 14–40)
Alkaline Phosphatase: 89 (ref 25–125)
Bilirubin, Total: 0.3

## 2022-04-09 LAB — CBC AND DIFFERENTIAL
HCT: 43 (ref 41–53)
Hemoglobin: 14.7 (ref 13.5–17.5)
Platelets: 230 10*3/uL (ref 150–400)
WBC: 6.7

## 2022-04-09 LAB — BASIC METABOLIC PANEL
BUN: 7 (ref 4–21)
Chloride: 99 (ref 99–108)
Creatinine: 1 (ref 0.6–1.3)
Glucose: 106
Potassium: 4.5 mEq/L (ref 3.5–5.1)
Sodium: 138 (ref 137–147)

## 2022-04-09 LAB — LIPID PANEL
Cholesterol: 223 — AB (ref 0–200)
HDL: 40 (ref 35–70)
LDL Cholesterol: 143
LDl/HDL Ratio: 5.6
Triglycerides: 223 — AB (ref 40–160)

## 2022-04-09 LAB — TESTOSTERONE: Testosterone: 655

## 2022-04-09 LAB — VITAMIN D 25 HYDROXY (VIT D DEFICIENCY, FRACTURES): Vit D, 25-Hydroxy: 25.8

## 2022-04-09 LAB — TSH: TSH: 1.48 (ref 0.41–5.90)

## 2022-04-09 LAB — PSA: PSA: 3.1

## 2022-04-09 LAB — HEMOGLOBIN A1C: Hemoglobin A1C: 6.6

## 2022-04-13 ENCOUNTER — Encounter: Payer: Self-pay | Admitting: Family Medicine

## 2022-04-20 ENCOUNTER — Encounter: Payer: Self-pay | Admitting: Family Medicine

## 2022-04-20 ENCOUNTER — Ambulatory Visit: Payer: BC Managed Care – PPO | Admitting: Family Medicine

## 2022-04-20 VITALS — BP 100/64 | HR 60 | Temp 98.0°F | Ht 70.0 in | Wt 248.8 lb

## 2022-04-20 DIAGNOSIS — M79651 Pain in right thigh: Secondary | ICD-10-CM

## 2022-04-20 DIAGNOSIS — R202 Paresthesia of skin: Secondary | ICD-10-CM | POA: Diagnosis not present

## 2022-04-20 MED ORDER — MELOXICAM 15 MG PO TABS: 15.0000 mg | ORAL_TABLET | Freq: Every day | ORAL | 3 refills | Status: DC

## 2022-04-20 NOTE — Progress Notes (Unsigned)
   Acute Office Visit  Subjective:     Patient ID: Larry Cochran, male    DOB: 10-23-1960, 62 y.o.   MRN: [id number]  Chief Complaint  Patient presents with   Numbness    Patient says about a week or so ago. He noticed tingling and numbness in his L foot. Patient says he does not have much in the R foot. Patient says the past couple of days have not been as bad as it was last week.    Leg Problem    Patient says he has also noticed from time to time he will notice some burning in his R upper thigh.     Paresthesia Numbness and tingling across the top of his left foot that started 1-2 weeks ago. Started using foot massager one week ago, had numbness and tingling  Location: L foot Pain: no Severity: 3/10  Quality:  tingling and pins and needles Frequency: intermittent; notices more when sitting still, less noticeable when walking around.  Bilateral: no Symmetric: no Numbness: yes Decreased sensation: no Weakness: no Context: stable Alleviating factors: foot bath/massager Aggravating factors: None  Treatments attempted: None Denies recent injuries/trauma to the left foot and denies changes in his activity.   Burning sensation in right thigh that started two weeks ago, occurs sporadically and every other day. He denies radiation, loss of sensation, weakness, limited range of motion, and redness. He has no alleviating/aggravating factors. Has not tried to take anything for it and he has never experienced this before.     Review of Systems  Constitutional:  Negative for chills, fever and malaise/fatigue.  Respiratory:  Negative for shortness of breath.   Cardiovascular:  Negative for chest pain and palpitations.  Neurological:  Positive for sensory change.       Left foot numbness/tingling        Objective:    BP 100/64   Pulse 60   Temp 98 F (36.7 C) (Oral)   Ht 5\' 10"  (1.778 m)   Wt 248 lb 12.8 oz (112.9 kg)   SpO2 99%   BMI 35.70 kg/m  {Vitals History  (Optional):23777}  Physical Exam Constitutional:      Appearance: Normal appearance.  Cardiovascular:     Rate and Rhythm: Regular rhythm.  Pulmonary:     Effort: Pulmonary effort is normal.     Breath sounds: Normal breath sounds.  Neurological:     Mental Status: He is alert.     Sensory: No sensory deficit.     Motor: No weakness.     Coordination: Coordination normal.     Gait: Gait normal.    No results found for any visits on 04/20/22.      Assessment & Plan:   Problem List Items Addressed This Visit   None Visit Diagnoses     Paresthesia of left foot    -  Primary   Stable. Prescribed Mobic for relief as needed. Provided patient with foot exercises.   Thigh pain, musculoskeletal, right       Stable. Prescribed Mobic for relief as needed. Provided patient with back and foot exercises.       Meds ordered this encounter  Medications   meloxicam (MOBIC) 15 MG tablet    Sig: Take 1 tablet (15 mg total) by mouth daily.    Dispense:  30 tablet    Refill:  3    No follow-ups on file.  Rashelle Pearley

## 2022-04-20 NOTE — Progress Notes (Unsigned)
BP 100/64   Pulse 60   Temp 98 F (36.7 C) (Oral)   Ht 5' 10"$  (1.778 m)   Wt 248 lb 12.8 oz (112.9 kg)   SpO2 99%   BMI 35.70 kg/m    Subjective:    Patient ID: Larry Cochran, male    DOB: 08-18-1960, 62 y.o.   MRN: [id number]  HPI: Larry Cochran is a 62 y.o. male  Chief Complaint  Patient presents with   Numbness    Patient says about a week or so ago. He noticed tingling and numbness in his L foot. Patient says he does not have much in the R foot. Patient says the past couple of days have not been as bad as it was last week.    Leg Problem    Patient says he has also noticed from time to time he will notice some burning in his R upper thigh.    NUMBNESS Duration: {Blank single:19197::"days","weeks","months","chronic"} Onset: {Blank single:19197::"sudden","gradual"} Location: L foot  Bilateral: no Symmetric: no Decreased sensation: no  Weakness: no Pain: no Quality:  numb and tingling Severity: 4/10  Frequency: constant, waxing and waning Trauma: no Recent illness: no Diabetes: no Thyroid disease: no  HIV: no  Alcoholism: no  Spinal cord injury: no Status: worse Treatments attempted:   Relevant past medical, surgical, family and social history reviewed and updated as indicated. Interim medical history since our last visit reviewed. Allergies and medications reviewed and updated.  Review of Systems  Per HPI unless specifically indicated above     Objective:    BP 100/64   Pulse 60   Temp 98 F (36.7 C) (Oral)   Ht 5' 10"$  (1.778 m)   Wt 248 lb 12.8 oz (112.9 kg)   SpO2 99%   BMI 35.70 kg/m   Wt Readings from Last 3 Encounters:  04/20/22 248 lb 12.8 oz (112.9 kg)  11/13/21 245 lb 3.2 oz (111.2 kg)  05/08/21 254 lb (115.2 kg)    Physical Exam  Results for orders placed or performed in visit on 04/13/22  CBC and differential  Result Value Ref Range   Hemoglobin 14.7 13.5 - 17.5   HCT 43 41 - 53   Platelets 230 150 - 400 K/uL   WBC 6.7    VITAMIN D 25 Hydroxy (Vit-D Deficiency, Fractures)  Result Value Ref Range   Vit D, 25-Hydroxy 0000000   Basic metabolic panel  Result Value Ref Range   Glucose 106    BUN 7 4 - 21   Creatinine 1.0 0.6 - 1.3   Potassium 4.5 3.5 - 5.1 mEq/L   Sodium 138 137 - 147   Chloride 99 99 - 108  Comprehensive metabolic panel  Result Value Ref Range   Globulin 2.1    Calcium 9.3 8.7 - 10.7   Albumin 4.5 3.5 - 5.0  Lipid panel  Result Value Ref Range   LDl/HDL Ratio 5.6    Triglycerides 223 (A) 40 - 160   Cholesterol 223 (A) 0 - 200   HDL 40 35 - 70   LDL Cholesterol 143   Hepatic function panel  Result Value Ref Range   Alkaline Phosphatase 89 25 - 125   ALT 18 10 - 40 U/L   AST 25 14 - 40   Bilirubin, Total 0.3   Hemoglobin A1c  Result Value Ref Range   Hemoglobin A1C 6.6   PSA  Result Value Ref Range   PSA 3.1   Testosterone  Result Value Ref Range   Testosterone 655   TSH  Result Value Ref Range   TSH 1.48 0.41 - 5.90      Assessment & Plan:   Problem List Items Addressed This Visit   None    Follow up plan: No follow-ups on file.

## 2022-04-22 ENCOUNTER — Encounter: Payer: Self-pay | Admitting: Family Medicine

## 2022-05-14 ENCOUNTER — Encounter: Payer: BC Managed Care – PPO | Admitting: Family Medicine

## 2022-05-24 DIAGNOSIS — G4733 Obstructive sleep apnea (adult) (pediatric): Secondary | ICD-10-CM | POA: Diagnosis not present

## 2022-06-11 ENCOUNTER — Encounter: Payer: Self-pay | Admitting: Family Medicine

## 2022-06-11 ENCOUNTER — Ambulatory Visit (INDEPENDENT_AMBULATORY_CARE_PROVIDER_SITE_OTHER): Payer: BC Managed Care – PPO | Admitting: Family Medicine

## 2022-06-11 VITALS — BP 108/69 | HR 51 | Temp 98.2°F | Ht 70.0 in | Wt 233.4 lb

## 2022-06-11 DIAGNOSIS — I10 Essential (primary) hypertension: Secondary | ICD-10-CM

## 2022-06-11 DIAGNOSIS — Z0000 Encounter for general adult medical examination without abnormal findings: Secondary | ICD-10-CM | POA: Diagnosis not present

## 2022-06-11 DIAGNOSIS — R202 Paresthesia of skin: Secondary | ICD-10-CM | POA: Diagnosis not present

## 2022-06-11 DIAGNOSIS — R7301 Impaired fasting glucose: Secondary | ICD-10-CM | POA: Diagnosis not present

## 2022-06-11 DIAGNOSIS — E291 Testicular hypofunction: Secondary | ICD-10-CM | POA: Diagnosis not present

## 2022-06-11 DIAGNOSIS — Z136 Encounter for screening for cardiovascular disorders: Secondary | ICD-10-CM | POA: Diagnosis not present

## 2022-06-11 LAB — URINALYSIS, ROUTINE W REFLEX MICROSCOPIC
Bilirubin, UA: NEGATIVE
Glucose, UA: NEGATIVE
Ketones, UA: NEGATIVE
Leukocytes,UA: NEGATIVE
Nitrite, UA: NEGATIVE
Specific Gravity, UA: 1.02 (ref 1.005–1.030)
Urobilinogen, Ur: 0.2 mg/dL (ref 0.2–1.0)
pH, UA: 6 (ref 5.0–7.5)

## 2022-06-11 LAB — MICROALBUMIN, URINE WAIVED
Creatinine, Urine Waived: 100 mg/dL (ref 10–300)
Microalb, Ur Waived: 80 mg/L — ABNORMAL HIGH (ref 0–19)
Microalb/Creat Ratio: <30 mg/g (ref <30)

## 2022-06-11 LAB — MICROSCOPIC EXAMINATION
Bacteria, UA: NONE SEEN
Epithelial Cells (non renal): NONE SEEN /hpf (ref 0–10)

## 2022-06-11 LAB — BAYER DCA HB A1C WAIVED: HB A1C (BAYER DCA - WAIVED): 6 % — ABNORMAL HIGH (ref 4.8–5.6)

## 2022-06-11 MED ORDER — MELOXICAM 15 MG PO TABS: 15.0000 mg | ORAL_TABLET | Freq: Every day | ORAL | 3 refills | Status: DC

## 2022-06-11 MED ORDER — BENAZEPRIL HCL 20 MG PO TABS: 20.0000 mg | ORAL_TABLET | Freq: Every day | ORAL | 1 refills | Status: DC

## 2022-06-11 NOTE — Assessment & Plan Note (Signed)
Was following with urology who retired. Would like to see a new one- referral generated today.

## 2022-06-11 NOTE — Progress Notes (Signed)
BP 108/69   Pulse (!) 51   Temp 98.2 F (36.8 C) (Oral)   Ht 5\' 10"  (1.778 m)   Wt 233 lb 6.4 oz (105.9 kg)   SpO2 98%   BMI 33.49 kg/m    Subjective:    Patient ID: Larry Cochran, male    DOB: 02/05/1961, 62 y.o.   MRN: [id number]  HPI: Larry Cochran is a 62 y.o. male presenting on 06/11/2022 for comprehensive medical examination. Current medical complaints include:  HYPERTENSION  Hypertension status: controlled  Satisfied with current treatment? yes Duration of hypertension: chronic BP monitoring frequency:  not checking BP medication side effects:  no Medication compliance: excellent compliance Previous BP meds:benazepril Aspirin: no Recurrent headaches: no Visual changes: no Palpitations: no Dyspnea: no Chest pain: no Lower extremity edema: no Dizzy/lightheaded: no  Impaired Fasting Glucose HbA1C:  Lab Results  Component Value Date   HGBA1C 6.0 (H) 06/11/2022   Duration of elevated blood sugar: chronic Polydipsia: no Polyuria: no Weight change: no Visual disturbance: no Glucose Monitoring: no Diabetic Education: Not Completed Family history of diabetes: yes  Notes that his L index finger will occasionally turn white a very cold and tingle. He continues with some numbness in his toes of his L foot. This has improved, but has not resolved. No other concerns or complaints at this time.   He currently lives with: wife Interim Problems from his last visit: no  Depression Screen done today and results listed below:     06/11/2022   11:25 AM 04/20/2022   11:23 AM 11/13/2021    8:31 AM 05/08/2021    2:15 PM 04/11/2020    2:22 PM  Depression screen PHQ 2/9  Decreased Interest 0 0 0 0 0  Down, Depressed, Hopeless 0 0 0 0 0  PHQ - 2 Score 0 0 0 0 0  Altered sleeping 0 0 0 0   Tired, decreased energy 0 0 0 0   Change in appetite 0 0 0 0   Feeling bad or failure about yourself  0 0 0 0   Trouble concentrating 0 0 0 0   Moving slowly or fidgety/restless 0 0 0 0    Suicidal thoughts 0 0 0 0   PHQ-9 Score 0 0 0 0   Difficult doing work/chores Not difficult at all Not difficult at all Not difficult at all Not difficult at all     Past Medical History:  Past Medical History:  Diagnosis Date   Hypertension    Low back pain     Surgical History:  Past Surgical History:  Procedure Laterality Date   MOHS SURGERY     SKIN BIOPSY     SKIN LESION EXCISION      Medications:  Current Outpatient Medications on File Prior to Visit  Medication Sig   aspirin EC 81 MG tablet Take 81 mg by mouth daily.   calcipotriene-betamethasone (TACLONEX SCALP) external suspension Apply topically as needed.   clotrimazole-betamethasone (LOTRISONE) cream SMARTSIG:1 Topical Daily PRN   ketoconazole (NIZORAL) 2 % shampoo    lansoprazole (PREVACID) 15 MG capsule Take 1 capsule (15 mg total) by mouth daily at 12 noon.   sildenafil (REVATIO) 20 MG tablet Take 20 mg by mouth daily.    tamsulosin (FLOMAX) 0.4 MG CAPS capsule Take 0.4 mg by mouth daily.   Testosterone Propionate (FIRST-TESTOSTERONE MC) 2 % CREA Place 200 mg/mL onto the skin 2 (two) times daily. 20% compounded cream   Vitamin D, Cholecalciferol, 10  MCG (400 UNIT) TABS Take 1 Dose by mouth daily.   No current facility-administered medications on file prior to visit.    Allergies:  No Known Allergies  Social History:  Social History   Socioeconomic History   Marital status: Single    Spouse name: Not on file   Number of children: Not on file   Years of education: Not on file   Highest education level: Not on file  Occupational History   Not on file  Tobacco Use   Smoking status: Former    Types: Cigars    Quit date: 10/28/2009    Years since quitting: 12.6   Smokeless tobacco: Never  Vaping Use   Vaping Use: Never used  Substance and Sexual Activity   Alcohol use: Yes    Alcohol/week: 0.0 standard drinks of alcohol    Comment: social   Drug use: No   Sexual activity: Yes  Other Topics  Concern   Not on file  Social History Narrative   Not on file   Social Determinants of Health   Financial Resource Strain: Not on file  Food Insecurity: Not on file  Transportation Needs: Not on file  Physical Activity: Not on file  Stress: Not on file  Social Connections: Not on file  Intimate Partner Violence: Not on file   Social History   Tobacco Use  Smoking Status Former   Types: Cigars   Quit date: 10/28/2009   Years since quitting: 12.6  Smokeless Tobacco Never   Social History   Substance and Sexual Activity  Alcohol Use Yes   Alcohol/week: 0.0 standard drinks of alcohol   Comment: social    Family History:  Family History  Problem Relation Age of Onset   AAA (abdominal aortic aneurysm) Mother    Diabetes Father    Bladder Cancer Brother    Benign prostatic hyperplasia Brother    Diabetes Paternal Grandmother    Heart disease Paternal Grandmother    Diabetes Paternal Grandfather     Past medical history, surgical history, medications, allergies, family history and social history reviewed with patient today and changes made to appropriate areas of the chart.   Review of Systems  Constitutional: Negative.   HENT: Negative.    Eyes: Negative.   Respiratory: Negative.    Cardiovascular: Negative.   Gastrointestinal:  Positive for diarrhea and heartburn. Negative for abdominal pain, blood in stool, constipation, melena, nausea and vomiting.  Genitourinary: Negative.   Musculoskeletal: Negative.   Skin: Negative.   Neurological:  Positive for tingling. Negative for dizziness, tremors, sensory change, speech change, focal weakness, seizures, loss of consciousness, weakness and headaches.  Endo/Heme/Allergies:  Positive for environmental allergies. Negative for polydipsia. Does not bruise/bleed easily.  Psychiatric/Behavioral: Negative.     All other ROS negative except what is listed above and in the HPI.      Objective:    BP 108/69   Pulse (!) 51    Temp 98.2 F (36.8 C) (Oral)   Ht 5\' 10"  (1.778 m)   Wt 233 lb 6.4 oz (105.9 kg)   SpO2 98%   BMI 33.49 kg/m   Wt Readings from Last 3 Encounters:  06/11/22 233 lb 6.4 oz (105.9 kg)  04/20/22 248 lb 12.8 oz (112.9 kg)  11/13/21 245 lb 3.2 oz (111.2 kg)    Physical Exam Vitals and nursing note reviewed.  Constitutional:      General: He is not in acute distress.    Appearance: Normal appearance. He  is obese. He is not ill-appearing, toxic-appearing or diaphoretic.  HENT:     Head: Normocephalic and atraumatic.     Right Ear: Tympanic membrane, ear canal and external ear normal. There is no impacted cerumen.     Left Ear: Tympanic membrane, ear canal and external ear normal. There is no impacted cerumen.     Nose: Nose normal. No congestion or rhinorrhea.     Mouth/Throat:     Mouth: Mucous membranes are moist.     Pharynx: Oropharynx is clear. No oropharyngeal exudate or posterior oropharyngeal erythema.  Eyes:     General: No scleral icterus.       Right eye: No discharge.        Left eye: No discharge.     Extraocular Movements: Extraocular movements intact.     Conjunctiva/sclera: Conjunctivae normal.     Pupils: Pupils are equal, round, and reactive to light.  Neck:     Vascular: No carotid bruit.  Cardiovascular:     Rate and Rhythm: Normal rate and regular rhythm.     Pulses: Normal pulses.     Heart sounds: No murmur heard.    No friction rub. No gallop.  Pulmonary:     Effort: Pulmonary effort is normal. No respiratory distress.     Breath sounds: Normal breath sounds. No stridor. No wheezing, rhonchi or rales.  Chest:     Chest wall: No tenderness.  Abdominal:     General: Abdomen is flat. Bowel sounds are normal. There is no distension.     Palpations: Abdomen is soft. There is no mass.     Tenderness: There is no abdominal tenderness. There is no right CVA tenderness, left CVA tenderness, guarding or rebound.     Hernia: No hernia is present.   Genitourinary:    Comments: Genital exam deferred with shared decision making Musculoskeletal:        General: No swelling, tenderness, deformity or signs of injury.     Cervical back: Normal range of motion and neck supple. No rigidity. No muscular tenderness.     Right lower leg: No edema.     Left lower leg: No edema.  Lymphadenopathy:     Cervical: No cervical adenopathy.  Skin:    General: Skin is warm and dry.     Capillary Refill: Capillary refill takes less than 2 seconds.     Coloration: Skin is not jaundiced or pale.     Findings: No bruising, erythema, lesion or rash.  Neurological:     General: No focal deficit present.     Mental Status: He is alert and oriented to person, place, and time.     Cranial Nerves: No cranial nerve deficit.     Sensory: No sensory deficit.     Motor: No weakness.     Coordination: Coordination normal.     Gait: Gait normal.     Deep Tendon Reflexes: Reflexes normal.  Psychiatric:        Mood and Affect: Mood normal.        Behavior: Behavior normal.        Thought Content: Thought content normal.        Judgment: Judgment normal.     Results for orders placed or performed in visit on 06/11/22  Microscopic Examination   Urine  Result Value Ref Range   WBC, UA 0-5 0 - 5 /hpf   RBC, Urine 0-2 0 - 2 /hpf   Epithelial Cells (non renal) None  seen 0 - 10 /hpf   Bacteria, UA None seen None seen/Few  Microalbumin, Urine Waived  Result Value Ref Range   Microalb, Ur Waived 80 (H) 0 - 19 mg/L   Creatinine, Urine Waived 100 10 - 300 mg/dL   Microalb/Creat Ratio <30 <30 mg/g  Urinalysis, Routine w reflex microscopic  Result Value Ref Range   Specific Gravity, UA 1.020 1.005 - 1.030   pH, UA 6.0 5.0 - 7.5   Color, UA Yellow Yellow   Appearance Ur Clear Clear   Leukocytes,UA Negative Negative   Protein,UA Trace (A) Negative/Trace   Glucose, UA Negative Negative   Ketones, UA Negative Negative   RBC, UA Trace (A) Negative   Bilirubin,  UA Negative Negative   Urobilinogen, Ur 0.2 0.2 - 1.0 mg/dL   Nitrite, UA Negative Negative   Microscopic Examination See below:   Bayer DCA Hb A1c Waived  Result Value Ref Range   HB A1C (BAYER DCA - WAIVED) 6.0 (H) 4.8 - 5.6 %      Assessment & Plan:   Problem List Items Addressed This Visit       Cardiovascular and Mediastinum   Hypertension    Under good control on current regimen. Continue current regimen. Continue to monitor. Call with any concerns. Refills given. Labs drawn today.        Relevant Medications   benazepril (LOTENSIN) 20 MG tablet   Other Relevant Orders   Microalbumin, Urine Waived (Completed)     Endocrine   Hypogonadism in male    Was following with urology who retired. Would like to see a new one- referral generated today.      Relevant Orders   Ambulatory referral to Urology   Impaired fasting glucose    Rechecking labs today. Await results. Treat as needed.       Relevant Orders   Bayer DCA Hb A1c Waived (Completed)   Other Visit Diagnoses     Routine general medical examination at a health care facility    -  Primary   Vaccines up to date. Screening labs checked today. Colonoscopy up to date. Continue diet and exercise. Call with any concerns.   Relevant Orders   Microalbumin, Urine Waived (Completed)   Comprehensive metabolic panel   CBC with Differential/Platelet   Lipid Panel w/o Chol/HDL Ratio   PSA   TSH   Urinalysis, Routine w reflex microscopic (Completed)   Bayer DCA Hb A1c Waived (Completed)   Paresthesia of left foot       Improved but not gone. ? [place]. Will wait until it's warmer, if not resolved, will go for x-ray, which is ordered.   Relevant Orders   DG Foot Complete Left        Discussed aspirin prophylaxis for myocardial infarction prevention and decision was made to continue ASA  LABORATORY TESTING:  Health maintenance labs ordered today as discussed above.   The natural history of prostate cancer and  ongoing controversy regarding screening and potential treatment outcomes of prostate cancer has been discussed with the patient. The meaning of a false positive PSA and a false negative PSA has been discussed. He indicates understanding of the limitations of this screening test and wishes to proceed with screening PSA testing.   IMMUNIZATIONS:   - Tdap: Tetanus vaccination status reviewed: last tetanus booster within 10 years. - Influenza: Up to date - Pneumovax: Not applicable - Prevnar: Not applicable - COVID: Up to date - HPV: Not applicable - Shingrix vaccine:  Refused  SCREENING: - Colonoscopy: Up to date  Discussed with patient purpose of the colonoscopy is to detect colon cancer at curable precancerous or early stages   PATIENT COUNSELING:    Sexuality: Discussed sexually transmitted diseases, partner selection, use of condoms, avoidance of unintended pregnancy  and contraceptive alternatives.   Advised to avoid cigarette smoking.  I discussed with the patient that most people either abstain from alcohol or drink within safe limits (<=14/week and <=4 drinks/occasion for males, <=7/weeks and <= 3 drinks/occasion for females) and that the risk for alcohol disorders and other health effects rises proportionally with the number of drinks per week and how often a drinker exceeds daily limits.  Discussed cessation/primary prevention of drug use and availability of treatment for abuse.   Diet: Encouraged to adjust caloric intake to maintain  or achieve ideal body weight, to reduce intake of dietary saturated fat and total fat, to limit sodium intake by avoiding high sodium foods and not adding table salt, and to maintain adequate dietary potassium and calcium preferably from fresh fruits, vegetables, and low-fat dairy products.    stressed the importance of regular exercise  Injury prevention: Discussed safety belts, safety helmets, smoke detector, smoking near bedding or upholstery.    Dental health: Discussed importance of regular tooth brushing, flossing, and dental visits.   Follow up plan: NEXT PREVENTATIVE PHYSICAL DUE IN 1 YEAR. Return in about 6 months (around 12/11/2022).

## 2022-06-11 NOTE — Assessment & Plan Note (Signed)
Under good control on current regimen. Continue current regimen. Continue to monitor. Call with any concerns. Refills given. Labs drawn today.   

## 2022-06-11 NOTE — Assessment & Plan Note (Signed)
Rechecking labs today. Await results. Treat as needed.  °

## 2022-06-12 LAB — CBC WITH DIFFERENTIAL/PLATELET
Basophils Absolute: 0 10*3/uL (ref 0.0–0.2)
Basos: 1 %
EOS (ABSOLUTE): 0.1 10*3/uL (ref 0.0–0.4)
Eos: 2 %
Hematocrit: 45.6 % (ref 37.5–51.0)
Hemoglobin: 15.2 g/dL (ref 13.0–17.7)
Immature Grans (Abs): 0.1 10*3/uL (ref 0.0–0.1)
Immature Granulocytes: 1 %
Lymphocytes Absolute: 1.9 10*3/uL (ref 0.7–3.1)
Lymphs: 29 %
MCH: 29.2 pg (ref 26.6–33.0)
MCHC: 33.3 g/dL (ref 31.5–35.7)
MCV: 88 fL (ref 79–97)
Monocytes Absolute: 0.3 10*3/uL (ref 0.1–0.9)
Monocytes: 5 %
Neutrophils Absolute: 4.1 10*3/uL (ref 1.4–7.0)
Neutrophils: 62 %
Platelets: 261 10*3/uL (ref 150–450)
RBC: 5.21 x10E6/uL (ref 4.14–5.80)
RDW: 13.4 % (ref 11.6–15.4)
WBC: 6.5 10*3/uL (ref 3.4–10.8)

## 2022-06-12 LAB — COMPREHENSIVE METABOLIC PANEL
ALT: 20 IU/L (ref 0–44)
AST: 11 IU/L (ref 0–40)
Albumin/Globulin Ratio: 2.3 — ABNORMAL HIGH (ref 1.2–2.2)
Albumin: 4.5 g/dL (ref 3.8–4.9)
Alkaline Phosphatase: 99 IU/L (ref 44–121)
BUN/Creatinine Ratio: 18 (ref 10–24)
BUN: 21 mg/dL (ref 8–27)
Bilirubin Total: 0.2 mg/dL (ref 0.0–1.2)
CO2: 24 mmol/L (ref 20–29)
Calcium: 9.2 mg/dL (ref 8.6–10.2)
Chloride: 101 mmol/L (ref 96–106)
Creatinine, Ser: 1.16 mg/dL (ref 0.76–1.27)
Globulin, Total: 2 g/dL (ref 1.5–4.5)
Glucose: 97 mg/dL (ref 70–99)
Potassium: 4.1 mmol/L (ref 3.5–5.2)
Sodium: 140 mmol/L (ref 134–144)
Total Protein: 6.5 g/dL (ref 6.0–8.5)
eGFR: 72 mL/min/{1.73_m2} (ref >59)

## 2022-06-12 LAB — PSA: Prostate Specific Ag, Serum: 4.1 ng/mL — ABNORMAL HIGH (ref 0.0–4.0)

## 2022-06-12 LAB — LIPID PANEL W/O CHOL/HDL RATIO
Cholesterol, Total: 193 mg/dL (ref 100–199)
HDL: 37 mg/dL — ABNORMAL LOW (ref >39)
LDL Chol Calc (NIH): 121 mg/dL — ABNORMAL HIGH (ref 0–99)
Triglycerides: 200 mg/dL — ABNORMAL HIGH (ref 0–149)
VLDL Cholesterol Cal: 35 mg/dL (ref 5–40)

## 2022-06-12 LAB — TSH: TSH: 1.41 u[IU]/mL (ref 0.450–4.500)

## 2022-06-18 DIAGNOSIS — L400 Psoriasis vulgaris: Secondary | ICD-10-CM | POA: Diagnosis not present

## 2022-06-18 DIAGNOSIS — D2272 Melanocytic nevi of left lower limb, including hip: Secondary | ICD-10-CM | POA: Diagnosis not present

## 2022-06-18 DIAGNOSIS — D2261 Melanocytic nevi of right upper limb, including shoulder: Secondary | ICD-10-CM | POA: Diagnosis not present

## 2022-06-18 DIAGNOSIS — Z85828 Personal history of other malignant neoplasm of skin: Secondary | ICD-10-CM | POA: Diagnosis not present

## 2022-06-24 DIAGNOSIS — G4733 Obstructive sleep apnea (adult) (pediatric): Secondary | ICD-10-CM | POA: Diagnosis not present

## 2022-07-24 DIAGNOSIS — G4733 Obstructive sleep apnea (adult) (pediatric): Secondary | ICD-10-CM | POA: Diagnosis not present

## 2022-08-04 DIAGNOSIS — H2513 Age-related nuclear cataract, bilateral: Secondary | ICD-10-CM | POA: Diagnosis not present

## 2022-08-04 DIAGNOSIS — H52223 Regular astigmatism, bilateral: Secondary | ICD-10-CM | POA: Diagnosis not present

## 2022-08-04 DIAGNOSIS — H5213 Myopia, bilateral: Secondary | ICD-10-CM | POA: Diagnosis not present

## 2022-08-06 ENCOUNTER — Other Ambulatory Visit
Admission: RE | Admit: 2022-08-06 | Discharge: 2022-08-06 | Disposition: A | Discharge status: Home / Self Care | Payer: BC Managed Care – PPO | Attending: Urology | Admitting: Urology

## 2022-08-06 ENCOUNTER — Ambulatory Visit: Payer: BC Managed Care – PPO | Admitting: Urology

## 2022-08-06 VITALS — BP 106/66 | HR 76 | Ht 71.0 in | Wt 224.0 lb

## 2022-08-06 DIAGNOSIS — E291 Testicular hypofunction: Secondary | ICD-10-CM

## 2022-08-06 DIAGNOSIS — Z87438 Personal history of other diseases of male genital organs: Secondary | ICD-10-CM | POA: Insufficient documentation

## 2022-08-06 DIAGNOSIS — R972 Elevated prostate specific antigen [PSA]: Secondary | ICD-10-CM | POA: Diagnosis not present

## 2022-08-06 DIAGNOSIS — N529 Male erectile dysfunction, unspecified: Secondary | ICD-10-CM | POA: Insufficient documentation

## 2022-08-06 DIAGNOSIS — Z8052 Family history of malignant neoplasm of bladder: Secondary | ICD-10-CM | POA: Insufficient documentation

## 2022-08-06 DIAGNOSIS — Z79890 Hormone replacement therapy (postmenopausal): Secondary | ICD-10-CM | POA: Insufficient documentation

## 2022-08-06 DIAGNOSIS — N400 Enlarged prostate without lower urinary tract symptoms: Secondary | ICD-10-CM | POA: Insufficient documentation

## 2022-08-06 DIAGNOSIS — N528 Other male erectile dysfunction: Secondary | ICD-10-CM

## 2022-08-06 DIAGNOSIS — Z87891 Personal history of nicotine dependence: Secondary | ICD-10-CM | POA: Insufficient documentation

## 2022-08-06 DIAGNOSIS — Z79899 Other long term (current) drug therapy: Secondary | ICD-10-CM | POA: Insufficient documentation

## 2022-08-06 LAB — PSA: Prostatic Specific Antigen: 3.72 ng/mL (ref 0.00–4.00)

## 2022-08-06 NOTE — Progress Notes (Signed)
[name],acting as a scribe for [name], MD.,have documented all relevant documentation on the behalf of [name], MD,as directed by  [name], MD while in the presence of [name], MD.  08/06/2022 9:45 AM   Larry Cochran 05/19/1960 [id number]  Referring provider: Dorcas Carrow, DO [address] [place],  [place] [address]  Chief Complaint  Patient presents with   New Patient (Initial Visit)    Hypogonadism in male     HPI: 62 year-old male with a personal history of hypogonadism and BPH, previously managed by Dr. Evelene Croon who is seeking to establish care. He is managed on testosterone cream, 20%, 1cc daily. He also has a history of erectile dysfunction. He is managed on Flomax for his BPH.  His most recent PSA on 06/11/2022 was 4.1. His PSA has been in the 2-3 range primarily since 2015 but has high as 3.6 in 2022.   His urinalysis on 06/11/2022 was negative.   His most recent testosterone level on 04/09/2022 was 655.   His most recent hematocrit and hemoglobin were within normal range in April.   He reports that his father had prostate enlargement but no cancer and his brother passed away from bladder cancer at age 34, was a long-term smoker.  Records from Dr. Sheppard Penton were personally reviewed.    Androgen Deficiency in the Aging Male     Row Name 08/06/22 0900         Androgen Deficiency in the Aging Male   Do you have a decrease in libido (sex drive) Yes     Do you have lack of energy No     Do you have a decrease in strength and/or endurance No     Have you lost height No     Have you noticed a decreased "enjoyment of life" No     Are you sad and/or grumpy No     Are your erections less strong Yes     Have you noticed a recent deterioration in your ability to play sports No     Are you falling asleep after dinner No     Has there been a recent deterioration in your work performance No                 SHIM     Row Name 08/06/22 0918          SHIM: Over the last 6 months:   How do you rate your confidence that you could get and keep an erection? High     When you had erections with sexual stimulation, how often were your erections hard enough for penetration (entering your partner)? Most Times (much more than half the time)     During sexual intercourse, how often were you able to maintain your erection after you had penetrated (entered) your partner? Most Times (much more than half the time)     During sexual intercourse, how difficult was it to maintain your erection to completion of intercourse? Slightly Difficult     When you attempted sexual intercourse, how often was it satisfactory for you? Sometimes (about half the time)       SHIM Total Score   SHIM 19              IPSS     Row Name 08/06/22 0900         International Prostate Symptom Score   How often have you had the sensation of  not emptying your bladder? Less than half the time     How often have you had to urinate less than every two hours? More than half the time     How often have you found you stopped and started again several times when you urinated? Not at All     How often have you found it difficult to postpone urination? About half the time     How often have you had a weak urinary stream? Not at All     How often have you had to strain to start urination? Not at All     How many times did you typically get up at night to urinate? 1 Time     Total IPSS Score 10       Quality of Life due to urinary symptoms   If you were to spend the rest of your life with your urinary condition just the way it is now how would you feel about that? Mostly Satisfied              Score:  1-7 Mild 8-19 Moderate 20-35 Severe    PMH: Past Medical History:  Diagnosis Date   Hypertension    Low back pain     Surgical History: Past Surgical History:  Procedure Laterality Date   MOHS SURGERY     SKIN BIOPSY     SKIN LESION EXCISION       Home Medications:  Allergies as of 08/06/2022   No Known Allergies      Medication List        Accurate as of Aug 06, 2022  9:45 AM. If you have any questions, ask your nurse or doctor.          STOP taking these medications    clotrimazole-betamethasone cream Commonly known as: LOTRISONE       TAKE these medications    aspirin EC 81 MG tablet Take 81 mg by mouth daily.   benazepril 20 MG tablet Commonly known as: LOTENSIN Take 1 tablet (20 mg total) by mouth daily.   calcipotriene-betamethasone external suspension Commonly known as: TACLONEX SCALP Apply topically as needed.   First-Testosterone MC 2 % Crea Place 200 mg/mL onto the skin 2 (two) times daily. 20% compounded cream   ketoconazole 2 % shampoo Commonly known as: NIZORAL   lansoprazole 15 MG capsule Commonly known as: PREVACID Take 1 capsule (15 mg total) by mouth daily at 12 noon.   meloxicam 15 MG tablet Commonly known as: MOBIC Take 1 tablet (15 mg total) by mouth daily.   sildenafil 20 MG tablet Commonly known as: REVATIO Take 20 mg by mouth daily.   tamsulosin 0.4 MG Caps capsule Commonly known as: FLOMAX Take 0.4 mg by mouth daily.   Vitamin D (Cholecalciferol) 10 MCG (400 UNIT) Tabs Take 1 Dose by mouth daily.        Family History: Family History  Problem Relation Age of Onset   AAA (abdominal aortic aneurysm) Mother    Diabetes Father    Bladder Cancer Brother    Benign prostatic hyperplasia Brother    Diabetes Paternal Grandmother    Heart disease Paternal Grandmother    Diabetes Paternal Grandfather     Social History:  reports that he quit smoking about 12 years ago. His smoking use included cigars. He has never used smokeless tobacco. He reports current alcohol use. He reports that he does not use drugs.   Physical Exam: BP 106/66  Pulse 76   Ht 5\' 11"  (1.803 m)   Wt 224 lb (101.6 kg)   BMI 31.24 kg/m   Constitutional:  Alert and oriented, No acute  distress. HEENT: [place] AT, moist mucus membranes.  Trachea midline, no masses. Rectal: Prostate mildly enlarged, no nodules detected. Neurologic: Grossly intact, no focal deficits, moving all 4 extremities. Psychiatric: Normal mood and affect.   Assessment & Plan:    1. Hypogonadism - Managed on testosterone cream 20% 1 cc daily. We will continue this dose as it seems to be working well. -Currently has more than ample supply, has purchase this out-of-pocket.  May benefit from checking his formulary to see what is covered and cost effective.  He will let us know we will consider changing the prescription at that point. - Plan for repeat testosterone level in 6 months with an H&H  2. Elevated PSA - PSA has risen, plan to repeat this today - Monitor PSA levels closely; consider further evaluation if levels remain elevated.  3. BPH - Continue Flomax as needed - Monitor symptoms and consider further interventions if symptoms worsen.  4. Erectile dysfunction - Continue sildenafil 20 mg daily - Refill prescription through [facility] for cost-effectiveness once he runs out, he will let us know.  5. Family history of bladder cancer - Monitor for any urinary symptoms or hematuria. - Annual urinalysis to check for microscopic blood.  Return in about 6 months (around 02/05/2023) for follow up and labs.   [facility] [address], [address] [place], [place] [address] [phone]

## 2022-08-08 LAB — TESTOSTERONE: Testosterone: 643 ng/dL (ref 264–916)

## 2022-08-25 DIAGNOSIS — G4733 Obstructive sleep apnea (adult) (pediatric): Secondary | ICD-10-CM | POA: Diagnosis not present

## 2022-09-24 DIAGNOSIS — G4733 Obstructive sleep apnea (adult) (pediatric): Secondary | ICD-10-CM | POA: Diagnosis not present

## 2022-09-28 ENCOUNTER — Encounter: Payer: Self-pay | Admitting: Urology

## 2022-09-28 NOTE — Telephone Encounter (Signed)
Medication pulled down for review. 

## 2022-09-29 MED ORDER — NONFORMULARY OR COMPOUNDED ITEM: 5 refills | Status: DC

## 2022-10-11 ENCOUNTER — Other Ambulatory Visit: Payer: Self-pay | Admitting: Family Medicine

## 2022-10-12 NOTE — Telephone Encounter (Signed)
Requested medication (s) are due for refill today: Yes  Requested medication (s) are on the active medication list: Yes  Last refill:  06/11/22  Future visit scheduled: Yes  Notes to clinic:  Manual review.    Requested Prescriptions  Pending Prescriptions Disp Refills   meloxicam (MOBIC) 15 MG tablet [Pharmacy Med Name: MELOXICAM 15 MG TABLET] 30 tablet 3    Sig: Take 1 tablet (15 mg total) by mouth daily.     Analgesics:  COX2 Inhibitors Failed - 10/11/2022  2:39 AM      Failed - Manual Review: Labs are only required if the patient has taken medication for more than 8 weeks.      Passed - HGB in normal range and within 360 days    Hemoglobin  Date Value Ref Range Status  06/11/2022 15.2 13.0 - 17.7 g/dL Final         Passed - Cr in normal range and within 360 days    Creatinine, Ser  Date Value Ref Range Status  06/11/2022 1.16 0.76 - 1.27 mg/dL Final         Passed - HCT in normal range and within 360 days    Hematocrit  Date Value Ref Range Status  06/11/2022 45.6 37.5 - 51.0 % Final         Passed - AST in normal range and within 360 days    AST  Date Value Ref Range Status  06/11/2022 11 0 - 40 IU/L Final         Passed - ALT in normal range and within 360 days    ALT  Date Value Ref Range Status  06/11/2022 20 0 - 44 IU/L Final         Passed - eGFR is 30 or above and within 360 days    GFR calc Af Amer  Date Value Ref Range Status  08/17/2019 104 >59 mL/min/1.73 Final    Comment:    **[facility] currently reports eGFR in compliance with the current**   recommendations of the [employer]. [facility] will   update reporting as new guidelines are published from the NKF-ASN   Task force.    GFR calc non Af Amer  Date Value Ref Range Status  08/17/2019 90 >59 mL/min/1.73 Final   eGFR  Date Value Ref Range Status  06/11/2022 72 >59 mL/min/1.73 Final         Passed - Patient is not pregnant      Passed - Valid encounter within last 12  months    Recent Outpatient Visits           4 months ago Routine general medical examination at a health care facility   [facility], Megan P, DO   5 months ago Paresthesia of left foot   [facility] [place], [place], DO   11 months ago Primary hypertension   [facility] [place], [place] P, DO   1 year ago Routine general medical examination at a health care facility   [facility] [place], [place] P, DO   1 year ago RLQ abdominal pain   [place] Loura Pardon, MD       Future Appointments             In 2 months Laural Benes, Oralia Rud, DO [place] [facility]   In 3 months [name], MD [facility]  [place]

## 2022-10-25 DIAGNOSIS — G4733 Obstructive sleep apnea (adult) (pediatric): Secondary | ICD-10-CM | POA: Diagnosis not present

## 2022-11-13 ENCOUNTER — Other Ambulatory Visit: Payer: Self-pay | Admitting: Family Medicine

## 2022-11-15 NOTE — Telephone Encounter (Signed)
Requested Prescriptions  Refused Prescriptions Disp Refills   benazepril (LOTENSIN) 20 MG tablet [Pharmacy Med Name: BENAZEPRIL HCL 20 MG TABLET] 90 tablet 1    Sig: TAKE 1 TABLET BY MOUTH EVERY DAY     Cardiovascular:  ACE Inhibitors Passed - 11/13/2022  9:17 AM      Passed - Cr in normal range and within 180 days    Creatinine, Ser  Date Value Ref Range Status  06/11/2022 1.16 0.76 - 1.27 mg/dL Final         Passed - K in normal range and within 180 days    Potassium  Date Value Ref Range Status  06/11/2022 4.1 3.5 - 5.2 mmol/L Final         Passed - Patient is not pregnant      Passed - Last BP in normal range    BP Readings from Last 1 Encounters:  08/06/22 106/66         Passed - Valid encounter within last 6 months    Recent Outpatient Visits           5 months ago Routine general medical examination at a health care facility   [facility] [place], Megan P, DO   6 months ago Paresthesia of left foot   [facility] [place], [place], DO   1 year ago Primary hypertension   [place] [place], [place] P, DO   1 year ago Routine general medical examination at a health care facility   [facility] [place], [place] P, DO   1 year ago RLQ abdominal pain   [facility] [place], [place], MD       Future Appointments             In 1 month Laural Benes, Oralia Rud, DO [facility]   In 2 months [name], MD [facility] [place]

## 2022-11-25 DIAGNOSIS — G4733 Obstructive sleep apnea (adult) (pediatric): Secondary | ICD-10-CM | POA: Diagnosis not present

## 2022-12-17 ENCOUNTER — Ambulatory Visit: Payer: BC Managed Care – PPO | Admitting: Family Medicine

## 2022-12-17 ENCOUNTER — Encounter: Payer: Self-pay | Admitting: Family Medicine

## 2022-12-17 VITALS — BP 100/66 | HR 58 | Wt 234.4 lb

## 2022-12-17 DIAGNOSIS — N400 Enlarged prostate without lower urinary tract symptoms: Secondary | ICD-10-CM | POA: Insufficient documentation

## 2022-12-17 DIAGNOSIS — Z23 Encounter for immunization: Secondary | ICD-10-CM | POA: Diagnosis not present

## 2022-12-17 DIAGNOSIS — I10 Essential (primary) hypertension: Secondary | ICD-10-CM

## 2022-12-17 DIAGNOSIS — R7301 Impaired fasting glucose: Secondary | ICD-10-CM

## 2022-12-17 DIAGNOSIS — N401 Enlarged prostate with lower urinary tract symptoms: Secondary | ICD-10-CM | POA: Diagnosis not present

## 2022-12-17 MED ORDER — BENAZEPRIL HCL 20 MG PO TABS: 20.0000 mg | ORAL_TABLET | Freq: Every day | ORAL | 1 refills | Status: DC

## 2022-12-17 MED ORDER — TAMSULOSIN HCL 0.4 MG PO CAPS: 0.4000 mg | ORAL_CAPSULE | Freq: Every day | ORAL | 1 refills | Status: DC

## 2022-12-17 MED ORDER — LANSOPRAZOLE 15 MG PO CPDR: 15.0000 mg | DELAYED_RELEASE_CAPSULE | Freq: Every day | ORAL | 3 refills | Status: DC

## 2022-12-17 NOTE — Progress Notes (Signed)
BP 100/66   Pulse (!) 58   Wt 234 lb 6.4 oz (106.3 kg)   SpO2 95%   BMI 32.69 kg/m    Subjective:    Patient ID: Larry Cochran, male    DOB: 12-16-1960, 62 y.o.   MRN: [id number]  HPI: Larry Cochran is a 62 y.o. male  No chief complaint on file.  HYPERTENSION  Hypertension status: controlled  Satisfied with current treatment? yes Duration of hypertension: chronic BP monitoring frequency:  not checking BP medication side effects:  no Medication compliance: excellent compliance Previous BP meds: benzepril Aspirin: no Recurrent headaches: no Visual changes: no Palpitations: no Dyspnea: no Chest pain: no Lower extremity edema: no Dizzy/lightheaded: no  Impaired Fasting Glucose HbA1C:  Lab Results  Component Value Date   HGBA1C 6.0 (H) 06/11/2022   Duration of elevated blood sugar: chronic Polydipsia: no Polyuria: no Weight change: no Visual disturbance: no Glucose Monitoring: no Diabetic Education: Completed Family history of diabetes: no  BPH BPH status: controlled Satisfied with current treatment?: yes Medication side effects: no Medication compliance: excellent compliance Duration: chronic Nocturia: 1/night Urinary frequency:no Incomplete voiding: no Urgency: no Weak urinary stream: no Straining to start stream: no Dysuria: no Onset: gradual    Relevant past medical, surgical, family and social history reviewed and updated as indicated. Interim medical history since our last visit reviewed. Allergies and medications reviewed and updated.  Review of Systems  Constitutional: Negative.   Respiratory: Negative.    Cardiovascular: Negative.   Musculoskeletal: Negative.   Neurological: Negative.   Psychiatric/Behavioral: Negative.      Per HPI unless specifically indicated above     Objective:    BP 100/66   Pulse (!) 58   Wt 234 lb 6.4 oz (106.3 kg)   SpO2 95%   BMI 32.69 kg/m   Wt Readings from Last 3 Encounters:  12/17/22 234  lb 6.4 oz (106.3 kg)  08/06/22 224 lb (101.6 kg)  06/11/22 233 lb 6.4 oz (105.9 kg)    Physical Exam Vitals and nursing note reviewed.  Constitutional:      General: He is not in acute distress.    Appearance: Normal appearance. He is not ill-appearing, toxic-appearing or diaphoretic.  HENT:     Head: Normocephalic and atraumatic.     Right Ear: External ear normal.     Left Ear: External ear normal.     Nose: Nose normal.     Mouth/Throat:     Mouth: Mucous membranes are moist.     Pharynx: Oropharynx is clear.  Eyes:     General: No scleral icterus.       Right eye: No discharge.        Left eye: No discharge.     Extraocular Movements: Extraocular movements intact.     Conjunctiva/sclera: Conjunctivae normal.     Pupils: Pupils are equal, round, and reactive to light.  Cardiovascular:     Rate and Rhythm: Normal rate and regular rhythm.     Pulses: Normal pulses.     Heart sounds: Normal heart sounds. No murmur heard.    No friction rub. No gallop.  Pulmonary:     Effort: Pulmonary effort is normal. No respiratory distress.     Breath sounds: Normal breath sounds. No stridor. No wheezing, rhonchi or rales.  Chest:     Chest wall: No tenderness.  Musculoskeletal:        General: Normal range of motion.     Cervical back: Normal range  of motion and neck supple.  Skin:    General: Skin is warm and dry.     Capillary Refill: Capillary refill takes less than 2 seconds.     Coloration: Skin is not jaundiced or pale.     Findings: No bruising, erythema, lesion or rash.  Neurological:     General: No focal deficit present.     Mental Status: He is alert and oriented to person, place, and time. Mental status is at baseline.  Psychiatric:        Mood and Affect: Mood normal.        Behavior: Behavior normal.        Thought Content: Thought content normal.        Judgment: Judgment normal.     Results for orders placed or performed during the hospital encounter of  08/06/22  Testosterone  Result Value Ref Range   Testosterone 643 264 - 916 ng/dL  PSA  Result Value Ref Range   Prostatic Specific Antigen 3.72 0.00 - 4.00 ng/mL      Assessment & Plan:   Problem List Items Addressed This Visit       Cardiovascular and Mediastinum   Hypertension    Under good control on current regimen. Continue current regimen. Continue to monitor. Call with any concerns. Refills given. Labs drawn today.       Relevant Medications   benazepril (LOTENSIN) 20 MG tablet   Other Relevant Orders   Basic metabolic panel     Endocrine   Impaired fasting glucose    Rechecking labs today. Await results. Treat as needed.       Relevant Orders   Basic metabolic panel   Hgb A1c w/o eAG     Genitourinary   BPH (benign prostatic hyperplasia)    Under good control on current regimen. Continue current regimen. Continue to monitor. Call with any concerns. Refills given.  Labs done with urology in May.      Relevant Medications   tamsulosin (FLOMAX) 0.4 MG CAPS capsule   Other Visit Diagnoses     Needs flu shot    -  Primary   Relevant Orders   Flu vaccine trivalent PF, 6mos and older(Flulaval,Afluria,Fluarix,Fluzone) (Completed)        Follow up plan: Return in about 6 months (around 06/17/2023) for physical.

## 2022-12-17 NOTE — Assessment & Plan Note (Signed)
Under good control on current regimen. Continue current regimen. Continue to monitor. Call with any concerns. Refills given.  Labs done with urology in May.

## 2022-12-17 NOTE — Assessment & Plan Note (Signed)
Under good control on current regimen. Continue current regimen. Continue to monitor. Call with any concerns. Refills given. Labs drawn today.   

## 2022-12-17 NOTE — Assessment & Plan Note (Signed)
Rechecking labs today. Await results. Treat as needed.  °

## 2022-12-18 LAB — BASIC METABOLIC PANEL
BUN/Creatinine Ratio: 16 (ref 10–24)
BUN: 17 mg/dL (ref 8–27)
CO2: 22 mmol/L (ref 20–29)
Calcium: 9.2 mg/dL (ref 8.6–10.2)
Chloride: 100 mmol/L (ref 96–106)
Creatinine, Ser: 1.05 mg/dL (ref 0.76–1.27)
Glucose: 114 mg/dL — ABNORMAL HIGH (ref 70–99)
Potassium: 4.6 mmol/L (ref 3.5–5.2)
Sodium: 139 mmol/L (ref 134–144)
eGFR: 81 mL/min/{1.73_m2} (ref >59)

## 2022-12-18 LAB — HGB A1C W/O EAG: Hgb A1c MFr Bld: 6 % — ABNORMAL HIGH (ref 4.8–5.6)

## 2022-12-24 DIAGNOSIS — D2261 Melanocytic nevi of right upper limb, including shoulder: Secondary | ICD-10-CM | POA: Diagnosis not present

## 2022-12-24 DIAGNOSIS — D2272 Melanocytic nevi of left lower limb, including hip: Secondary | ICD-10-CM | POA: Diagnosis not present

## 2022-12-24 DIAGNOSIS — D225 Melanocytic nevi of trunk: Secondary | ICD-10-CM | POA: Diagnosis not present

## 2022-12-24 DIAGNOSIS — D2262 Melanocytic nevi of left upper limb, including shoulder: Secondary | ICD-10-CM | POA: Diagnosis not present

## 2023-01-19 ENCOUNTER — Other Ambulatory Visit: Payer: Self-pay

## 2023-01-19 DIAGNOSIS — E291 Testicular hypofunction: Secondary | ICD-10-CM

## 2023-01-28 ENCOUNTER — Encounter: Payer: Self-pay | Admitting: Urology

## 2023-01-28 ENCOUNTER — Other Ambulatory Visit: Payer: Self-pay

## 2023-01-28 ENCOUNTER — Ambulatory Visit: Payer: BC Managed Care – PPO | Admitting: Urology

## 2023-01-28 ENCOUNTER — Other Ambulatory Visit
Admission: RE | Admit: 2023-01-28 | Discharge: 2023-01-28 | Disposition: A | Discharge status: Home / Self Care | Payer: BC Managed Care – PPO | Attending: Urology | Admitting: Urology

## 2023-01-28 VITALS — BP 100/67 | HR 75 | Ht 71.0 in | Wt 236.0 lb

## 2023-01-28 DIAGNOSIS — E291 Testicular hypofunction: Secondary | ICD-10-CM | POA: Insufficient documentation

## 2023-01-28 DIAGNOSIS — N400 Enlarged prostate without lower urinary tract symptoms: Secondary | ICD-10-CM | POA: Insufficient documentation

## 2023-01-28 DIAGNOSIS — R972 Elevated prostate specific antigen [PSA]: Secondary | ICD-10-CM

## 2023-01-28 DIAGNOSIS — N528 Other male erectile dysfunction: Secondary | ICD-10-CM | POA: Diagnosis not present

## 2023-01-28 LAB — HEMOGLOBIN AND HEMATOCRIT, BLOOD
HCT: 43.1 % (ref 39.0–52.0)
Hemoglobin: 14.7 g/dL (ref 13.0–17.0)

## 2023-01-28 LAB — PSA: Prostatic Specific Antigen: 3.3 ng/mL (ref 0.00–4.00)

## 2023-01-28 MED ORDER — SILDENAFIL CITRATE 20 MG PO TABS: ORAL_TABLET | ORAL | 11 refills | Status: DC

## 2023-01-28 NOTE — Progress Notes (Signed)
Larry Cochran,acting as a scribe for Larry Scotland, MD.,have documented all relevant documentation on the behalf of Larry Scotland, MD,as directed by  Larry Scotland, MD while in the presence of Larry Scotland, MD.  01/28/2023 9:33 AM   Larry Cochran 03-20-1960 [id number]  Referring provider: Dorcas Carrow, DO [address] [place],  [place] [address]  Chief Complaint  Patient presents with   Hypogonadism    HPI: 62 year-old male who has multiple issues including hypogonadism and BPH who wishes to follow up.   Please see previous notes for details. Unfortunately, he did not get labs for today's visit.   Today, he reports feeling well and has no complaints. He uses testosterone cream once daily, despite a previous recommendation to use it twice daily due to lower testosterone levels. He would also like a refill of sildenafil 20 mg, which he takes daily.   PMH: Past Medical History:  Diagnosis Date   Hypertension    Low back pain     Surgical History: Past Surgical History:  Procedure Laterality Date   MOHS SURGERY     SKIN BIOPSY     SKIN LESION EXCISION      Home Medications:  Allergies as of 01/28/2023   No Known Allergies      Medication List        Accurate as of January 28, 2023  9:33 AM. If you have any questions, ask your nurse or doctor.          STOP taking these medications    meloxicam 15 MG tablet Commonly known as: MOBIC Stopped by: Larry Cochran       TAKE these medications    aspirin EC 81 MG tablet Take 81 mg by mouth daily.   benazepril 20 MG tablet Commonly known as: LOTENSIN Take 1 tablet (20 mg total) by mouth daily.   clobetasol 0.05 % external solution Commonly known as: TEMOVATE Apply topically.   ketoconazole 2 % shampoo Commonly known as: NIZORAL   lansoprazole 15 MG capsule Commonly known as: PREVACID Take 1 capsule (15 mg total) by mouth daily at 12 noon.   NONFORMULARY OR COMPOUNDED ITEM Testosterone 20  % compounded cream  Apply 1 cc twice daily   sildenafil 20 MG tablet Commonly known as: REVATIO Take 1 to 5 tablets as needed 1 hour prior to intercourse What changed:  how much to take how to take this when to take this additional instructions Changed by: Larry Cochran   tamsulosin 0.4 MG Caps capsule Commonly known as: FLOMAX Take 1 capsule (0.4 mg total) by mouth daily.   Vitamin D (Cholecalciferol) 10 MCG (400 UNIT) Tabs Take 1 Dose by mouth daily.        Family History: Family History  Problem Relation Age of Onset   AAA (abdominal aortic aneurysm) Mother    Diabetes Father    Bladder Cancer Brother    Benign prostatic hyperplasia Brother    Diabetes Paternal Grandmother    Heart disease Paternal Grandmother    Diabetes Paternal Grandfather     Social History:  reports that he quit smoking about 13 years ago. His smoking use included cigars. He has never used smokeless tobacco. He reports current alcohol use. He reports that he does not use drugs.   Physical Exam: BP 100/67 (BP Location: Left Arm, Patient Position: Sitting, Cuff Size: Normal)   Pulse 75   Ht 5\' 11"  (1.803 m)   Wt 236 lb (107 kg)  BMI 32.92 kg/m   Constitutional:  Alert and oriented, No acute distress. HEENT: [place] AT, moist mucus membranes.  Trachea midline, no masses. Neurologic: Grossly intact, no focal deficits, moving all 4 extremities. Psychiatric: Normal mood and affect.   Assessment & Plan:    1. Erectile dysfunction - Continue sildenafil 20 mg daily - Refill was provided  2. Hypogonadism - Labs to assess testosterone levels, PSA, hemoglobin, and hematocrit were not completed prior to this visit - He reports feeling good with no complaints - He uses testosterone cream once daily  - He is open to adjusting the dosage based on lab results  3. BPH - No specific complaints or symptoms related to BPH were reported during this visit. - Continue current management. No changes at  this time.  Return in about 3 months (around 04/30/2023) for testosterone, PSA, hemoglobin, hematorcrit levels, and DRE.  I have reviewed the above documentation for accuracy and completeness, and I agree with the above.   Larry Scotland, MD    [facility] [address] [place], [place] [address] [phone]

## 2023-01-30 LAB — TESTOSTERONE: Testosterone: 652 ng/dL (ref 264–916)

## 2023-03-01 DIAGNOSIS — G4733 Obstructive sleep apnea (adult) (pediatric): Secondary | ICD-10-CM | POA: Diagnosis not present

## 2023-04-01 DIAGNOSIS — G4733 Obstructive sleep apnea (adult) (pediatric): Secondary | ICD-10-CM | POA: Diagnosis not present

## 2023-04-19 LAB — VITAMIN D 25 HYDROXY (VIT D DEFICIENCY, FRACTURES): Vit D, 25-Hydroxy: 19.4

## 2023-04-19 LAB — LIPID PANEL
Cholesterol: 274 — AB (ref 0–200)
HDL: 37 (ref 35–70)
LDL Cholesterol: 165
Triglycerides: 372 — AB (ref 40–160)

## 2023-04-19 LAB — PSA: PSA: 3.7

## 2023-04-19 LAB — BASIC METABOLIC PANEL WITH GFR
BUN: 17 (ref 4–21)
Chloride: 100 (ref 99–108)
Creatinine: 1 (ref 0.6–1.3)
Glucose: 110
Potassium: 4.5 meq/L (ref 3.5–5.1)
Sodium: 141 (ref 137–147)

## 2023-04-19 LAB — TSH: TSH: 2.26 (ref 0.41–5.90)

## 2023-04-19 LAB — CBC AND DIFFERENTIAL
HCT: 44 (ref 41–53)
Hemoglobin: 14.5 (ref 13.5–17.5)
Platelets: 249 10*3/uL (ref 150–400)
WBC: 6.5

## 2023-04-19 LAB — HEPATIC FUNCTION PANEL
ALT: 16 U/L (ref 10–40)
AST: 22 (ref 14–40)
Alkaline Phosphatase: 91 (ref 25–125)
Bilirubin, Total: 0.4

## 2023-04-19 LAB — VITAMIN B12: Vitamin B-12: 190

## 2023-04-19 LAB — CBC: RBC: 4.92 (ref 3.87–5.11)

## 2023-04-19 LAB — HEMOGLOBIN A1C: Hemoglobin A1C: 6.7

## 2023-04-19 LAB — COMPREHENSIVE METABOLIC PANEL WITH GFR
Albumin: 4.1 (ref 3.5–5.0)
Calcium: 9.4 (ref 8.7–10.7)
Globulin: 2
eGFR: 84

## 2023-04-20 LAB — LAB REPORT - SCANNED
A1c: 6.7
Calcium: 9.4
EGFR: 84
Free T4: 6.9 ng/dL
TSH: 2.26 (ref 0.41–5.90)

## 2023-05-02 DIAGNOSIS — G4733 Obstructive sleep apnea (adult) (pediatric): Secondary | ICD-10-CM | POA: Diagnosis not present

## 2023-05-31 DIAGNOSIS — G4733 Obstructive sleep apnea (adult) (pediatric): Secondary | ICD-10-CM | POA: Diagnosis not present

## 2023-06-12 ENCOUNTER — Other Ambulatory Visit: Payer: Self-pay | Admitting: Family Medicine

## 2023-06-14 NOTE — Telephone Encounter (Signed)
 Requested Prescriptions  Pending Prescriptions Disp Refills   benazepril (LOTENSIN) 20 MG tablet [Pharmacy Med Name: BENAZEPRIL HCL 20 MG TABLET] 90 tablet 0    Sig: TAKE 1 TABLET BY MOUTH EVERY DAY     Cardiovascular:  ACE Inhibitors Failed - 06/14/2023  8:27 AM      Failed - Valid encounter within last 6 months    Recent Outpatient Visits   None     Future Appointments             In 1 week Dorcas Carrow, DO [facility]   In 1 month [name], MD [facility] [place]            Passed - Cr in normal range and within 180 days    Creatinine, Ser  Date Value Ref Range Status  12/17/2022 1.05 0.76 - 1.27 mg/dL Final         Passed - K in normal range and within 180 days    Potassium  Date Value Ref Range Status  12/17/2022 4.6 3.5 - 5.2 mmol/L Final         Passed - Patient is not pregnant      Passed - Last BP in normal range    BP Readings from Last 1 Encounters:  01/28/23 100/67

## 2023-06-17 ENCOUNTER — Encounter: Payer: BC Managed Care – PPO | Admitting: Family Medicine

## 2023-06-22 ENCOUNTER — Encounter: Payer: Self-pay | Admitting: Family Medicine

## 2023-06-22 ENCOUNTER — Ambulatory Visit: Payer: Self-pay | Admitting: Family Medicine

## 2023-06-22 VITALS — BP 97/67 | HR 98 | Temp 98.0°F | Ht 71.0 in | Wt 242.2 lb

## 2023-06-22 DIAGNOSIS — N401 Enlarged prostate with lower urinary tract symptoms: Secondary | ICD-10-CM

## 2023-06-22 DIAGNOSIS — R809 Proteinuria, unspecified: Secondary | ICD-10-CM | POA: Diagnosis not present

## 2023-06-22 DIAGNOSIS — I10 Essential (primary) hypertension: Secondary | ICD-10-CM

## 2023-06-22 DIAGNOSIS — Z0000 Encounter for general adult medical examination without abnormal findings: Secondary | ICD-10-CM | POA: Diagnosis not present

## 2023-06-22 DIAGNOSIS — E119 Type 2 diabetes mellitus without complications: Secondary | ICD-10-CM | POA: Diagnosis not present

## 2023-06-22 DIAGNOSIS — E1129 Type 2 diabetes mellitus with other diabetic kidney complication: Secondary | ICD-10-CM

## 2023-06-22 LAB — MICROALBUMIN, URINE WAIVED
Creatinine, Urine Waived: 50 mg/dL (ref 10–300)
Microalb, Ur Waived: 30 mg/L — ABNORMAL HIGH (ref 0–19)

## 2023-06-22 MED ORDER — TAMSULOSIN HCL 0.4 MG PO CAPS: 0.4000 mg | ORAL_CAPSULE | Freq: Every day | ORAL | 1 refills | Status: DC

## 2023-06-22 MED ORDER — BENAZEPRIL HCL 10 MG PO TABS
10.0000 mg | ORAL_TABLET | Freq: Every day | ORAL | 0 refills | Status: DC
Start: 2023-06-22 — End: 2023-08-26

## 2023-06-22 MED ORDER — VITAMIN D (ERGOCALCIFEROL) 1.25 MG (50000 UNIT) PO CAPS: 50000.0000 [IU] | ORAL_CAPSULE | ORAL | 1 refills | Status: DC

## 2023-06-22 MED ORDER — OZEMPIC (0.25 OR 0.5 MG/DOSE) 2 MG/3ML ~~LOC~~ SOPN: 0.5000 mg | PEN_INJECTOR | SUBCUTANEOUS | 0 refills | Status: DC

## 2023-06-22 MED ORDER — ROSUVASTATIN CALCIUM 5 MG PO TABS
5.0000 mg | ORAL_TABLET | Freq: Every day | ORAL | 0 refills | Status: DC
Start: 2023-06-22 — End: 2023-09-19

## 2023-06-22 MED ORDER — OZEMPIC (0.25 OR 0.5 MG/DOSE) 2 MG/3ML ~~LOC~~ SOPN: PEN_INJECTOR | SUBCUTANEOUS | Status: DC

## 2023-06-22 NOTE — Assessment & Plan Note (Signed)
 Newly diagnosed with A1c of 6.7. Will start him on ozempic and recheck tolerance in 2 months. Call with any concerns.

## 2023-06-22 NOTE — Progress Notes (Signed)
 BP 97/67 (BP Location: Left Arm, Patient Position: Sitting, Cuff Size: Large)   Pulse 98   Temp 98 F (36.7 C) (Oral)   Ht 5\' 11"  (1.803 m)   Wt 242 lb 3.2 oz (109.9 kg)   SpO2 95%   BMI 33.78 kg/m    Subjective:    Patient ID: Larry Cochran, male    DOB: 1960/09/09, 63 y.o.   MRN: [id number]  HPI: Jasyah Theurer is a 63 y.o. male presenting on 06/22/2023 for comprehensive medical examination. Current medical complaints include:  HYPERTENSION  Hypertension status: overtreated  Satisfied with current treatment? yes Duration of hypertension: chronic BP monitoring frequency:  rarely BP medication side effects:  no Medication compliance: excellent compliance Previous BP meds: benazepril Aspirin: yes Recurrent headaches: no Visual changes: no Palpitations: no Dyspnea: no Chest pain: no Lower extremity edema: no Dizzy/lightheaded: no  DIABETES Hypoglycemic episodes:no Polydipsia/polyuria: no Visual disturbance: no Chest pain: no Paresthesias: no Glucose Monitoring: no Taking Insulin?: no Blood Pressure Monitoring: not checking Retinal Examination: Not up to Date Foot Exam: Up to Date Diabetic Education: Not Completed Pneumovax: Not up to Date Influenza: Up to Date Aspirin: yes  BPH BPH status: controlled Satisfied with current treatment?: no Medication side effects: no Medication compliance: excellent compliance Duration: chronic Nocturia: 1-2x per night Urinary frequency:yes Incomplete voiding: no Urgency: no Weak urinary stream: no Straining to start stream: no Dysuria: no Onset: gradual Severity: mild  He currently lives with: wife Interim Problems from his last visit: no  Depression Screen done today and results listed below:     06/22/2023    8:07 AM 12/17/2022    8:12 AM 12/17/2022    8:08 AM 06/11/2022   11:25 AM 04/20/2022   11:23 AM  Depression screen PHQ 2/9  Decreased Interest 0 0 0 0 0  Down, Depressed, Hopeless 0 0 0 0 0  PHQ - 2  Score 0 0 0 0 0  Altered sleeping 0 0 0 0 0  Tired, decreased energy 0 0 0 0 0  Change in appetite 0 0 0 0 0  Feeling bad or failure about yourself  0 0 0 0 0  Trouble concentrating 0 0 0 0 0  Moving slowly or fidgety/restless 0 0 0 0 0  Suicidal thoughts 0 0 0 0 0  PHQ-9 Score 0 0 0 0 0  Difficult doing work/chores  Not difficult at all Not difficult at all Not difficult at all Not difficult at all    Past Medical History:  Past Medical History:  Diagnosis Date   Hypertension    Low back pain     Surgical History:  Past Surgical History:  Procedure Laterality Date   MOHS SURGERY     SKIN BIOPSY     SKIN LESION EXCISION      Medications:  Current Outpatient Medications on File Prior to Visit  Medication Sig   aspirin EC 81 MG tablet Take 81 mg by mouth daily.   clobetasol (TEMOVATE) 0.05 % external solution Apply topically.   ketoconazole (NIZORAL) 2 % shampoo    lansoprazole (PREVACID) 15 MG capsule Take 1 capsule (15 mg total) by mouth daily at 12 noon.   NONFORMULARY OR COMPOUNDED ITEM Testosterone 20 % compounded cream  Apply 1 cc twice daily   sildenafil (REVATIO) 20 MG tablet Take 1 to 5 tablets as needed 1 hour prior to intercourse   Vitamin D, Cholecalciferol, 10 MCG (400 UNIT) TABS Take 1 Dose by mouth daily. (  Patient not taking: Reported on 06/22/2023)   No current facility-administered medications on file prior to visit.    Allergies:  No Known Allergies  Social History:  Social History   Socioeconomic History   Marital status: Single    Spouse name: Not on file   Number of children: Not on file   Years of education: Not on file   Highest education level: Bachelor's degree (e.g., BA, AB, BS)  Occupational History   Not on file  Tobacco Use   Smoking status: Former    Types: Cigars    Quit date: 10/28/2009    Years since quitting: 13.6   Smokeless tobacco: Never   Tobacco comments:    Pt no longer smokes.  Vaping Use   Vaping status: Never  Used  Substance and Sexual Activity   Alcohol use: Yes    Alcohol/week: 0.0 standard drinks of alcohol    Comment: social   Drug use: No   Sexual activity: Yes  Other Topics Concern   Not on file  Social History Narrative   Not on file   Social Drivers of Health   Financial Resource Strain: Low Risk  (12/15/2022)   Overall Financial Resource Strain (CARDIA)    Difficulty of Paying Living Expenses: Not hard at all  Food Insecurity: No Food Insecurity (06/22/2023)   Hunger Vital Sign    Worried About Running Out of Food in the Last Year: Never true    Ran Out of Food in the Last Year: Never true  Transportation Needs: No Transportation Needs (06/22/2023)   PRAPARE - Administrator, Civil Service (Medical): No    Lack of Transportation (Non-Medical): No  Physical Activity: Unknown (12/15/2022)   Exercise Vital Sign    Days of Exercise per Week: Patient declined    Minutes of Exercise per Session: Not on file  Stress: No Stress Concern Present (06/22/2023)   [employer] of Occupational Health - Occupational Stress Questionnaire    Feeling of Stress : Not at all  Social Connections: Socially Integrated (06/22/2023)   Social Connection and Isolation Panel [NHANES]    Frequency of Communication with Friends and Family: More than three times a week    Frequency of Social Gatherings with Friends and Family: Three times a week    Attends Religious Services: 1 to 4 times per year    Active Member of Clubs or Organizations: No    Attends Banker Meetings: 1 to 4 times per year    Marital Status: Married  Catering manager Violence: Not At Risk (06/22/2023)   Humiliation, Afraid, Rape, and Kick questionnaire    Fear of Current or Ex-Partner: No    Emotionally Abused: No    Physically Abused: No    Sexually Abused: No   Social History   Tobacco Use  Smoking Status Former   Types: Cigars   Quit date: 10/28/2009   Years since quitting: 13.6  Smokeless  Tobacco Never  Tobacco Comments   Pt no longer smokes.   Social History   Substance and Sexual Activity  Alcohol Use Yes   Alcohol/week: 0.0 standard drinks of alcohol   Comment: social    Family History:  Family History  Problem Relation Age of Onset   AAA (abdominal aortic aneurysm) Mother    Diabetes Father    Bladder Cancer Brother    Benign prostatic hyperplasia Brother    Diabetes Paternal Grandmother    Heart disease Paternal Grandmother  Diabetes Paternal Grandfather     Past medical history, surgical history, medications, allergies, family history and social history reviewed with patient today and changes made to appropriate areas of the chart.   Review of Systems  Constitutional: Negative.   HENT: Negative.    Eyes: Negative.   Respiratory: Negative.    Cardiovascular:  Positive for palpitations. Negative for chest pain, orthopnea, claudication, leg swelling and PND.  Gastrointestinal:  Positive for diarrhea and heartburn. Negative for abdominal pain, blood in stool, constipation, melena, nausea and vomiting.  Genitourinary: Negative.   Musculoskeletal: Negative.   Skin: Negative.   Neurological:  Positive for tingling (occasionally L foot). Negative for dizziness, tremors, sensory change, speech change, focal weakness, seizures, loss of consciousness, weakness and headaches.  Endo/Heme/Allergies:  Positive for environmental allergies. Negative for polydipsia. Does not bruise/bleed easily.  Psychiatric/Behavioral: Negative.     All other ROS negative except what is listed above and in the HPI.      Objective:    BP 97/67 (BP Location: Left Arm, Patient Position: Sitting, Cuff Size: Large)   Pulse 98   Temp 98 F (36.7 C) (Oral)   Ht 5\' 11"  (1.803 m)   Wt 242 lb 3.2 oz (109.9 kg)   SpO2 95%   BMI 33.78 kg/m   Wt Readings from Last 3 Encounters:  06/22/23 242 lb 3.2 oz (109.9 kg)  01/28/23 236 lb (107 kg)  12/17/22 234 lb 6.4 oz (106.3 kg)     Physical Exam Vitals and nursing note reviewed.  Constitutional:      General: He is not in acute distress.    Appearance: Normal appearance. He is obese. He is not ill-appearing, toxic-appearing or diaphoretic.  HENT:     Head: Normocephalic and atraumatic.     Right Ear: Tympanic membrane, ear canal and external ear normal. There is no impacted cerumen.     Left Ear: Tympanic membrane, ear canal and external ear normal. There is no impacted cerumen.     Nose: Nose normal. No congestion or rhinorrhea.     Mouth/Throat:     Mouth: Mucous membranes are moist.     Pharynx: Oropharynx is clear. No oropharyngeal exudate or posterior oropharyngeal erythema.  Eyes:     General: No scleral icterus.       Right eye: No discharge.        Left eye: No discharge.     Extraocular Movements: Extraocular movements intact.     Conjunctiva/sclera: Conjunctivae normal.     Pupils: Pupils are equal, round, and reactive to light.  Neck:     Vascular: No carotid bruit.  Cardiovascular:     Rate and Rhythm: Normal rate and regular rhythm.     Pulses: Normal pulses.     Heart sounds: No murmur heard.    No friction rub. No gallop.  Pulmonary:     Effort: Pulmonary effort is normal. No respiratory distress.     Breath sounds: Normal breath sounds. No stridor. No wheezing, rhonchi or rales.  Chest:     Chest wall: No tenderness.  Abdominal:     General: Abdomen is flat. Bowel sounds are normal. There is no distension.     Palpations: Abdomen is soft. There is no mass.     Tenderness: There is no abdominal tenderness. There is no right CVA tenderness, left CVA tenderness, guarding or rebound.     Hernia: No hernia is present.  Genitourinary:    Comments: Genital exam deferred with shared  decision making Musculoskeletal:        General: No swelling, tenderness, deformity or signs of injury.     Cervical back: Normal range of motion and neck supple. No rigidity. No muscular tenderness.     Right  lower leg: No edema.     Left lower leg: No edema.  Lymphadenopathy:     Cervical: No cervical adenopathy.  Skin:    General: Skin is warm and dry.     Capillary Refill: Capillary refill takes less than 2 seconds.     Coloration: Skin is not jaundiced or pale.     Findings: No bruising, erythema, lesion or rash.  Neurological:     General: No focal deficit present.     Mental Status: He is alert and oriented to person, place, and time.     Cranial Nerves: No cranial nerve deficit.     Sensory: No sensory deficit.     Motor: No weakness.     Coordination: Coordination normal.     Gait: Gait normal.     Deep Tendon Reflexes: Reflexes normal.  Psychiatric:        Mood and Affect: Mood normal.        Behavior: Behavior normal.        Thought Content: Thought content normal.        Judgment: Judgment normal.     Results for orders placed or performed in visit on 06/22/23  CBC and differential   Collection Time: 04/19/23 12:00 AM  Result Value Ref Range   Hemoglobin 14.5 13.5 - 17.5   HCT 44 41 - 53   Platelets 249 150 - 400 K/uL   WBC 6.5   CBC   Collection Time: 04/19/23 12:00 AM  Result Value Ref Range   RBC 4.92 3.87 - 5.11  VITAMIN D 25 Hydroxy (Vit-D Deficiency, Fractures)   Collection Time: 04/19/23 12:00 AM  Result Value Ref Range   Vit D, 25-Hydroxy 19.4   Basic metabolic panel with GFR   Collection Time: 04/19/23 12:00 AM  Result Value Ref Range   Glucose 110    BUN 17 4 - 21   Creatinine 1.0 0.6 - 1.3   Potassium 4.5 3.5 - 5.1 mEq/L   Sodium 141 137 - 147   Chloride 100 99 - 108  Comprehensive metabolic panel with GFR   Collection Time: 04/19/23 12:00 AM  Result Value Ref Range   Globulin 2.0    eGFR 84    Calcium 9.4 8.7 - 10.7   Albumin 4.1 3.5 - 5.0  Lipid panel   Collection Time: 04/19/23 12:00 AM  Result Value Ref Range   Triglycerides 372 (A) 40 - 160   Cholesterol 274 (A) 0 - 200   HDL 37 35 - 70   LDL Cholesterol 165   Hepatic function  panel   Collection Time: 04/19/23 12:00 AM  Result Value Ref Range   Alkaline Phosphatase 91 25 - 125   ALT 16 10 - 40 U/L   AST 22 14 - 40   Bilirubin, Total 0.4   Vitamin B12   Collection Time: 04/19/23 12:00 AM  Result Value Ref Range   Vitamin B-12 190   Hemoglobin A1c   Collection Time: 04/19/23 12:00 AM  Result Value Ref Range   Hemoglobin A1C 6.7   PSA   Collection Time: 04/19/23 12:00 AM  Result Value Ref Range   PSA 3.7   TSH   Collection Time: 04/19/23 12:00 AM  Result Value Ref Range   TSH 2.26 0.41 - 5.90  Microalbumin, Urine Waived   Collection Time: 06/22/23  8:49 AM  Result Value Ref Range   Microalb, Ur Waived 30 (H) 0 - 19 mg/L   Creatinine, Urine Waived 50 10 - 300 mg/dL   Microalb/Creat Ratio 30-300 (H) <30 mg/g      Assessment & Plan:   Problem List Items Addressed This Visit       Cardiovascular and Mediastinum   Hypertension   Running low- will cut his benazepril to 10mg  and recheck in about 2 months.       Relevant Medications   benazepril (LOTENSIN) 10 MG tablet   rosuvastatin (CRESTOR) 5 MG tablet     Endocrine   Controlled type 2 diabetes mellitus with microalbuminuria, without long-term current use of insulin (HCC)   Newly diagnosed with A1c of 6.7. Will start him on ozempic and recheck tolerance in 2 months. Call with any concerns.       Relevant Medications   benazepril (LOTENSIN) 10 MG tablet   Semaglutide,0.25 or 0.5MG /DOS, (OZEMPIC, 0.25 OR 0.5 MG/DOSE,) 2 MG/3ML SOPN   rosuvastatin (CRESTOR) 5 MG tablet   Semaglutide,0.25 or 0.5MG /DOS, (OZEMPIC, 0.25 OR 0.5 MG/DOSE,) 2 MG/3ML SOPN   Other Relevant Orders   Microalbumin, Urine Waived (Completed)     Genitourinary   BPH (benign prostatic hyperplasia)   Under good control on current regimen. Continue current regimen. Continue to monitor. Call with any concerns. Refills given. Labs checked in February and normal.        Relevant Medications   tamsulosin (FLOMAX) 0.4 MG CAPS  capsule   Other Visit Diagnoses       Routine general medical examination at a health care facility    -  Primary   Vaccines up to date/declined. Screening labs checked today. Colonoscopy up to date. Continue diet and exercise. Call with any concerns.       LABORATORY TESTING:  Health maintenance labs ordered today as discussed above.   The natural history of prostate cancer and ongoing controversy regarding screening and potential treatment outcomes of prostate cancer has been discussed with the patient. The meaning of a false positive PSA and a false negative PSA has been discussed. He indicates understanding of the limitations of this screening test and wishes to proceed with screening PSA testing.   IMMUNIZATIONS:   - Tdap: Tetanus vaccination status reviewed: last tetanus booster within 10 years. - Influenza: Up to date - Pneumovax: Not applicable - Prevnar: Refused - COVID: Refused - HPV: Not applicable - Shingrix vaccine: will consider  SCREENING: - Colonoscopy: Up to date  Discussed with patient purpose of the colonoscopy is to detect colon cancer at curable precancerous or early stages   PATIENT COUNSELING:    Sexuality: Discussed sexually transmitted diseases, partner selection, use of condoms, avoidance of unintended pregnancy  and contraceptive alternatives.   Advised to avoid cigarette smoking.  I discussed with the patient that most people either abstain from alcohol or drink within safe limits (<=14/week and <=4 drinks/occasion for males, <=7/weeks and <= 3 drinks/occasion for females) and that the risk for alcohol disorders and other health effects rises proportionally with the number of drinks per week and how often a drinker exceeds daily limits.  Discussed cessation/primary prevention of drug use and availability of treatment for abuse.   Diet: Encouraged to adjust caloric intake to maintain  or achieve ideal body weight, to reduce intake of dietary saturated  fat  and total fat, to limit sodium intake by avoiding high sodium foods and not adding table salt, and to maintain adequate dietary potassium and calcium preferably from fresh fruits, vegetables, and low-fat dairy products.    stressed the importance of regular exercise  Injury prevention: Discussed safety belts, safety helmets, smoke detector, smoking near bedding or upholstery.   Dental health: Discussed importance of regular tooth brushing, flossing, and dental visits.   Follow up plan: NEXT PREVENTATIVE PHYSICAL DUE IN 1 YEAR. Return in about 2 months (around 08/22/2023).

## 2023-06-22 NOTE — Assessment & Plan Note (Signed)
 Running low- will cut his benazepril to 10mg  and recheck in about 2 months.

## 2023-06-22 NOTE — Assessment & Plan Note (Signed)
 Under good control on current regimen. Continue current regimen. Continue to monitor. Call with any concerns. Refills given. Labs checked in February and normal.

## 2023-06-26 ENCOUNTER — Encounter: Payer: Self-pay | Admitting: Family Medicine

## 2023-07-08 ENCOUNTER — Other Ambulatory Visit
Admission: RE | Admit: 2023-07-08 | Discharge: 2023-07-08 | Disposition: A | Discharge status: Home / Self Care | Attending: Urology | Admitting: Urology

## 2023-07-08 DIAGNOSIS — D2262 Melanocytic nevi of left upper limb, including shoulder: Secondary | ICD-10-CM | POA: Diagnosis not present

## 2023-07-08 DIAGNOSIS — D2261 Melanocytic nevi of right upper limb, including shoulder: Secondary | ICD-10-CM | POA: Diagnosis not present

## 2023-07-08 DIAGNOSIS — R972 Elevated prostate specific antigen [PSA]: Secondary | ICD-10-CM | POA: Diagnosis not present

## 2023-07-08 DIAGNOSIS — D225 Melanocytic nevi of trunk: Secondary | ICD-10-CM | POA: Diagnosis not present

## 2023-07-08 DIAGNOSIS — N400 Enlarged prostate without lower urinary tract symptoms: Secondary | ICD-10-CM | POA: Insufficient documentation

## 2023-07-08 DIAGNOSIS — E291 Testicular hypofunction: Secondary | ICD-10-CM | POA: Insufficient documentation

## 2023-07-08 DIAGNOSIS — D2272 Melanocytic nevi of left lower limb, including hip: Secondary | ICD-10-CM | POA: Diagnosis not present

## 2023-07-08 LAB — HEMOGLOBIN AND HEMATOCRIT, BLOOD
HCT: 42.8 % (ref 39.0–52.0)
Hemoglobin: 14.3 g/dL (ref 13.0–17.0)

## 2023-07-08 LAB — PSA: Prostatic Specific Antigen: 3.57 ng/mL (ref 0.00–4.00)

## 2023-07-09 LAB — TESTOSTERONE: Testosterone: 312 ng/dL (ref 264–916)

## 2023-07-15 ENCOUNTER — Ambulatory Visit: Admitting: Urology

## 2023-07-15 VITALS — BP 124/80 | HR 87 | Ht 71.0 in | Wt 235.0 lb

## 2023-07-15 DIAGNOSIS — E291 Testicular hypofunction: Secondary | ICD-10-CM | POA: Diagnosis not present

## 2023-07-15 DIAGNOSIS — R809 Proteinuria, unspecified: Secondary | ICD-10-CM

## 2023-07-15 DIAGNOSIS — E1129 Type 2 diabetes mellitus with other diabetic kidney complication: Secondary | ICD-10-CM

## 2023-07-15 DIAGNOSIS — N402 Nodular prostate without lower urinary tract symptoms: Secondary | ICD-10-CM

## 2023-07-15 DIAGNOSIS — E8881 Metabolic syndrome: Secondary | ICD-10-CM | POA: Diagnosis not present

## 2023-07-15 NOTE — Progress Notes (Signed)
 Larry Cochran,acting as a scribe for Larry Gimenez, MD.,have documented all relevant documentation on the behalf of Larry Gimenez, MD,as directed by  Larry Gimenez, MD while in the presence of Larry Gimenez, MD.  07/15/23 3:20 PM   Larry Cochran January 24, 1961 [id number]  Referring provider: Solomon Dupre, DO [address] [place],  [place] [address]  Chief Complaint  Patient presents with   Erectile Dysfunction   Benign Prostatic Hypertrophy    HPI:  63 year old male with a personal history of hypogonadism and primary pulmonary hypertension presents today for follow-up.   He reports that his testosterone  level is on the lower side at 312, and he has not been using his testosterone  treatment consistently, only applying it once a day instead of the prescribed twice daily. He has not noticed significant symptoms related to low testosterone , such as fatigue or decreased libido, and attributes some changes to aging.   He has recently been started on Ozempic  by Dr. Lincoln Renshaw due to an elevated A1C and has experienced some weight fluctuations, losing approximately five pounds.   He has a family history of diabetes, with his grandfather and father both affected, and is keen to avoid insulin therapy.  His PSA is stable at 3.57 as of 07/08/2023.   During today's visit, a digital rectal exam revealed a hard nodule on the right side of the prostate, which is asymmetric and concerning. He has no prior history of prostate nodules.   PMH: Past Medical History:  Diagnosis Date   Hypertension    Low back pain     Surgical History: Past Surgical History:  Procedure Laterality Date   MOHS SURGERY     SKIN BIOPSY     SKIN LESION EXCISION      Home Medications:  Allergies as of 07/15/2023   No Known Allergies      Medication List        Accurate as of Jul 15, 2023  3:20 PM. If you have any questions, ask your nurse or doctor.          STOP taking these medications    Vitamin D   (Cholecalciferol) 10 MCG (400 UNIT) Tabs Stopped by: Larry Cochran       TAKE these medications    aspirin EC 81 MG tablet Take 81 mg by mouth daily.   benazepril  10 MG tablet Commonly known as: LOTENSIN  Take 1 tablet (10 mg total) by mouth daily.   clobetasol 0.05 % external solution Commonly known as: TEMOVATE Apply topically.   ketoconazole 2 % shampoo Commonly known as: NIZORAL   lansoprazole  15 MG capsule Commonly known as: PREVACID  Take 1 capsule (15 mg total) by mouth daily at 12 noon.   NONFORMULARY OR COMPOUNDED ITEM Testosterone  20 % compounded cream  Apply 1 cc twice daily   Ozempic  (0.25 or 0.5 MG/DOSE) 2 MG/3ML Sopn Generic drug: Semaglutide (0.25 or 0.5MG /DOS) Inject 0.25 mg into the skin once a week for 28 days, THEN 0.5 mg once a week for 28 days. Start taking on: June 22, 2023   rosuvastatin  5 MG tablet Commonly known as: Crestor  Take 1 tablet (5 mg total) by mouth daily.   sildenafil  20 MG tablet Commonly known as: REVATIO  Take 1 to 5 tablets as needed 1 hour prior to intercourse   tamsulosin  0.4 MG Caps capsule Commonly known as: FLOMAX  Take 1 capsule (0.4 mg total) by mouth daily.   Vitamin D  (Ergocalciferol ) 1.25 MG (50000 UNIT) Caps capsule Commonly known  as: DRISDOL  Take 1 capsule (50,000 Units total) by mouth every 7 (seven) days.        Family History: Family History  Problem Relation Age of Onset   AAA (abdominal aortic aneurysm) Mother    Diabetes Father    Bladder Cancer Brother    Benign prostatic hyperplasia Brother    Diabetes Paternal Grandmother    Heart disease Paternal Grandmother    Diabetes Paternal Grandfather     Social History:  reports that he quit smoking about 13 years ago. His smoking use included cigars. He has never used smokeless tobacco. He reports current alcohol use. He reports that he does not use drugs.   Physical Exam: BP 124/80 (BP Location: Left Arm, Patient Position: Sitting, Cuff Size:  Large)   Pulse 87   Ht 5\' 11"  (1.803 m)   Wt 235 lb (106.6 kg)   SpO2 97%   BMI 32.78 kg/m   Constitutional:  Alert and oriented, No acute distress. HEENT: [place] AT, moist mucus membranes.  Trachea midline, no masses. GU: Discrete firm nodular area, somewhat linear, about the size of a grin of rice in the mid-right prostate. 50+ gram prostate, non-tender. Slightly decreased sphincter tone Neurologic: Grossly intact, no focal deficits, moving all 4 extremities. Psychiatric: Normal mood and affect.   Assessment & Plan:    1. Hypogonadism - His testosterone  level is on the lower side at 312, but it remains within the normal range.  - He has not been consistently using testosterone  treatment as prescribed, which may account for the lower levels.  - He reports no significant symptoms related to low testosterone , such as fatigue or decreased libido.  - Continue current management and encourage adherence to prescribed testosterone  regimen.  - Follow-up with Cathleen Coach, [place], for ongoing management of testosterone  therapy.  2. Prostate Nodule - A firm, non-symmetric nodule was palpated on the right side of the prostate during the rectal exam.  - PSA is stable at 3.57.  - An MRI of the prostate is ordered to further evaluate the nodule.  - If the MRI is unremarkable, continue monitoring PSA levels and follow up in six months. If the MRI shows concerning features, a biopsy may be considered.  - He is informed about the potential need for a biopsy and the availability of Pronox for comfort during the procedure.  3. Diabetes Management - He is on Ozempic  for elevated A1C and reports some weight loss.  - Continue current Ozempic  regimen and encourage dietary modifications to aid in weight loss and glycemic control.  - Monitor A1C and weight at follow-up visits.  4. Metabolic Syndrome - He is on medication for cholesterol and blood pressure, with recent adjustments made by Dr. Lincoln Renshaw.  - Continue  current management and monitor for potential medication adjustments as weight loss progresses.  Return in about 6 months (around 01/15/2024) for repeat PSA or sooner if MRI results indicate the need for further intervention.   [facility] [address] [place], [place] [address] [phone]

## 2023-07-15 NOTE — Patient Instructions (Signed)
 Please contact Central Scheduling to set up your prostate MRI at [phone].  Prostate MRI Prep:  1- No ejaculation 48 hours prior to exam  2- No caffeine or carbonated beverages on day of the exam  3- Eat light diet evening prior and day of exam  4- Avoid eating 4 hours prior to exam  5- Fleets enema needs to be done 4 hours prior to exam -See below. Can be purchased at the drug store.

## 2023-07-20 ENCOUNTER — Encounter: Payer: Self-pay | Admitting: Family Medicine

## 2023-07-22 ENCOUNTER — Ambulatory Visit: Payer: Self-pay | Admitting: Urology

## 2023-07-25 ENCOUNTER — Telehealth: Payer: Self-pay

## 2023-07-25 NOTE — Telephone Encounter (Signed)
 RX benefits PA submitted. EOC # [id number] to allow for checking status

## 2023-07-25 NOTE — Telephone Encounter (Signed)
 Copied from CRM [id number]. Topic: Clinical - Medication Prior Auth >> Jul 25, 2023  2:36 PM Santiya F wrote: Reason for CRM: Patient is calling in wanting to know the status of the Prior Authorization for his medication Semaglutide ,0.25 or 0.5MG /DOS, (OZEMPIC , 0.25 OR 0.5 MG/DOSE,) 2 MG/3ML SOPN [[id number]]. Patient says he takes his last shot on Wednesday and hasn't heard anything additional. Please advise.

## 2023-07-26 ENCOUNTER — Other Ambulatory Visit: Payer: Self-pay

## 2023-07-26 NOTE — Telephone Encounter (Signed)
 Approved and will send follow up message to patient.

## 2023-07-26 NOTE — Telephone Encounter (Signed)
Approved and patient aware

## 2023-07-27 MED ORDER — SEMAGLUTIDE(0.25 OR 0.5MG/DOS) 2 MG/3ML ~~LOC~~ SOPN: 0.5000 mL | PEN_INJECTOR | SUBCUTANEOUS | 0 refills | Status: DC

## 2023-07-29 ENCOUNTER — Ambulatory Visit
Admission: RE | Admit: 2023-07-29 | Discharge: 2023-07-29 | Disposition: A | Discharge status: Home / Self Care | Source: Ambulatory Visit | Attending: Urology | Admitting: Urology

## 2023-07-29 DIAGNOSIS — K5730 Diverticulosis of large intestine without perforation or abscess without bleeding: Secondary | ICD-10-CM | POA: Diagnosis not present

## 2023-07-29 DIAGNOSIS — N402 Nodular prostate without lower urinary tract symptoms: Secondary | ICD-10-CM | POA: Insufficient documentation

## 2023-07-29 DIAGNOSIS — M47817 Spondylosis without myelopathy or radiculopathy, lumbosacral region: Secondary | ICD-10-CM | POA: Diagnosis not present

## 2023-07-29 DIAGNOSIS — N400 Enlarged prostate without lower urinary tract symptoms: Secondary | ICD-10-CM | POA: Diagnosis not present

## 2023-07-29 MED ORDER — GADOBUTROL 1 MMOL/ML IV SOLN
10.0000 mL | Freq: Once | INTRAVENOUS | Status: AC | PRN
  Administered 2023-07-29: 10 mL via INTRAVENOUS

## 2023-07-31 DIAGNOSIS — G4733 Obstructive sleep apnea (adult) (pediatric): Secondary | ICD-10-CM | POA: Diagnosis not present

## 2023-08-03 ENCOUNTER — Ambulatory Visit: Payer: Self-pay | Admitting: Urology

## 2023-08-03 DIAGNOSIS — N400 Enlarged prostate without lower urinary tract symptoms: Secondary | ICD-10-CM

## 2023-08-03 DIAGNOSIS — E291 Testicular hypofunction: Secondary | ICD-10-CM

## 2023-08-03 DIAGNOSIS — R972 Elevated prostate specific antigen [PSA]: Secondary | ICD-10-CM

## 2023-08-15 IMAGING — DX DG ABDOMEN 2V
3 series · 3 of 3 positions shown · non-contrast
Comparison: None.

CLINICAL DATA: Pain right lower quadrant

EXAM:
ABDOMEN - 2 VIEW

[abdomen erect]
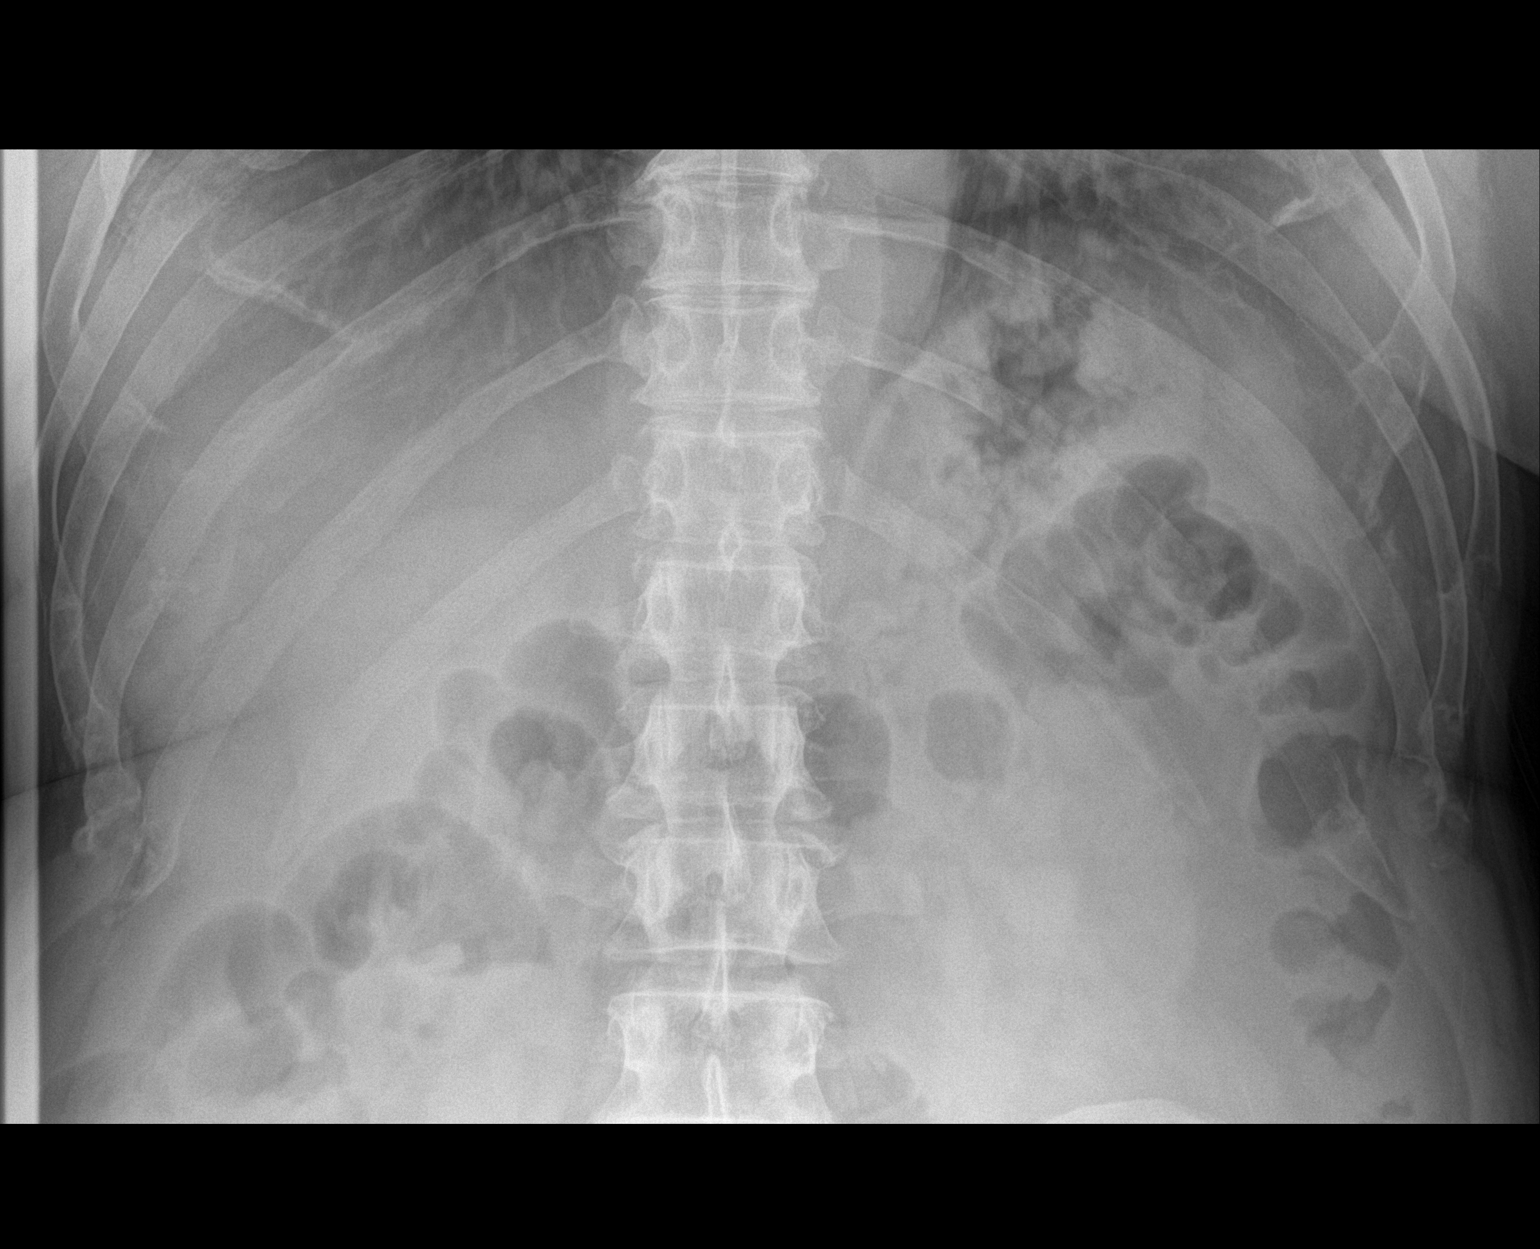

[abdomen supine (1 of 2)]
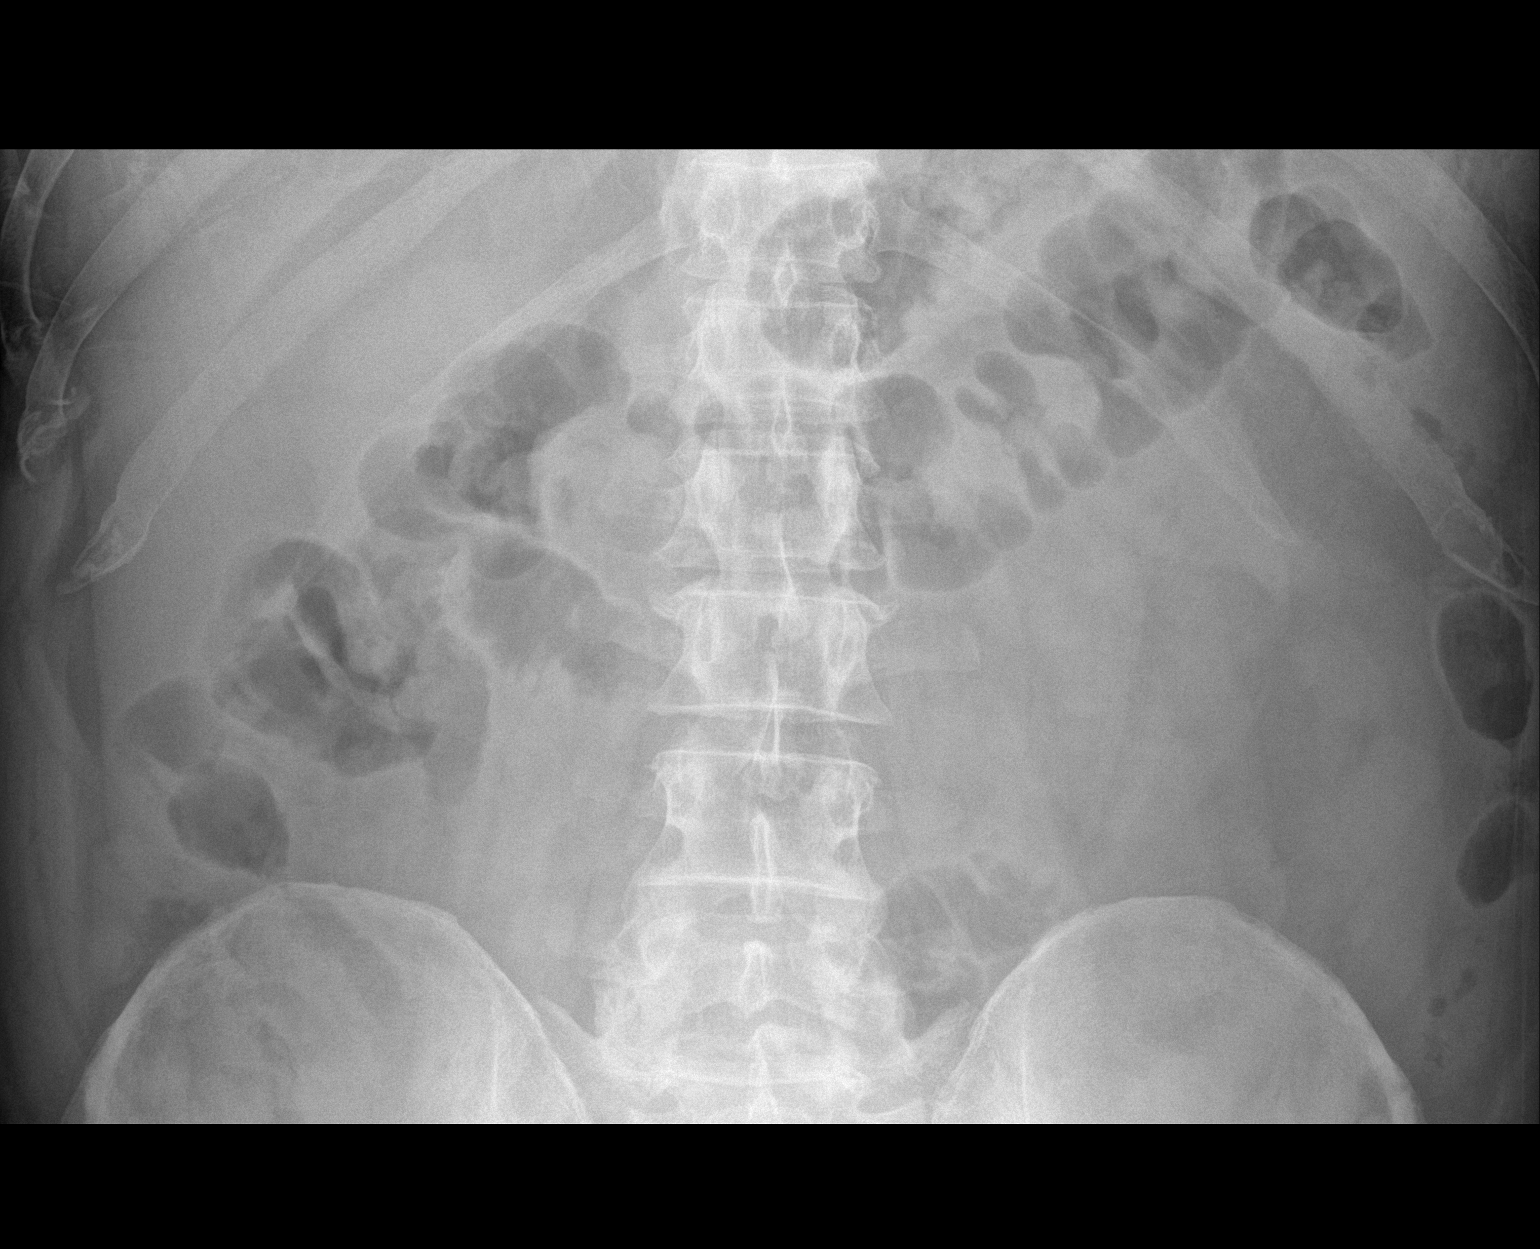

[abdomen supine (2 of 2)]
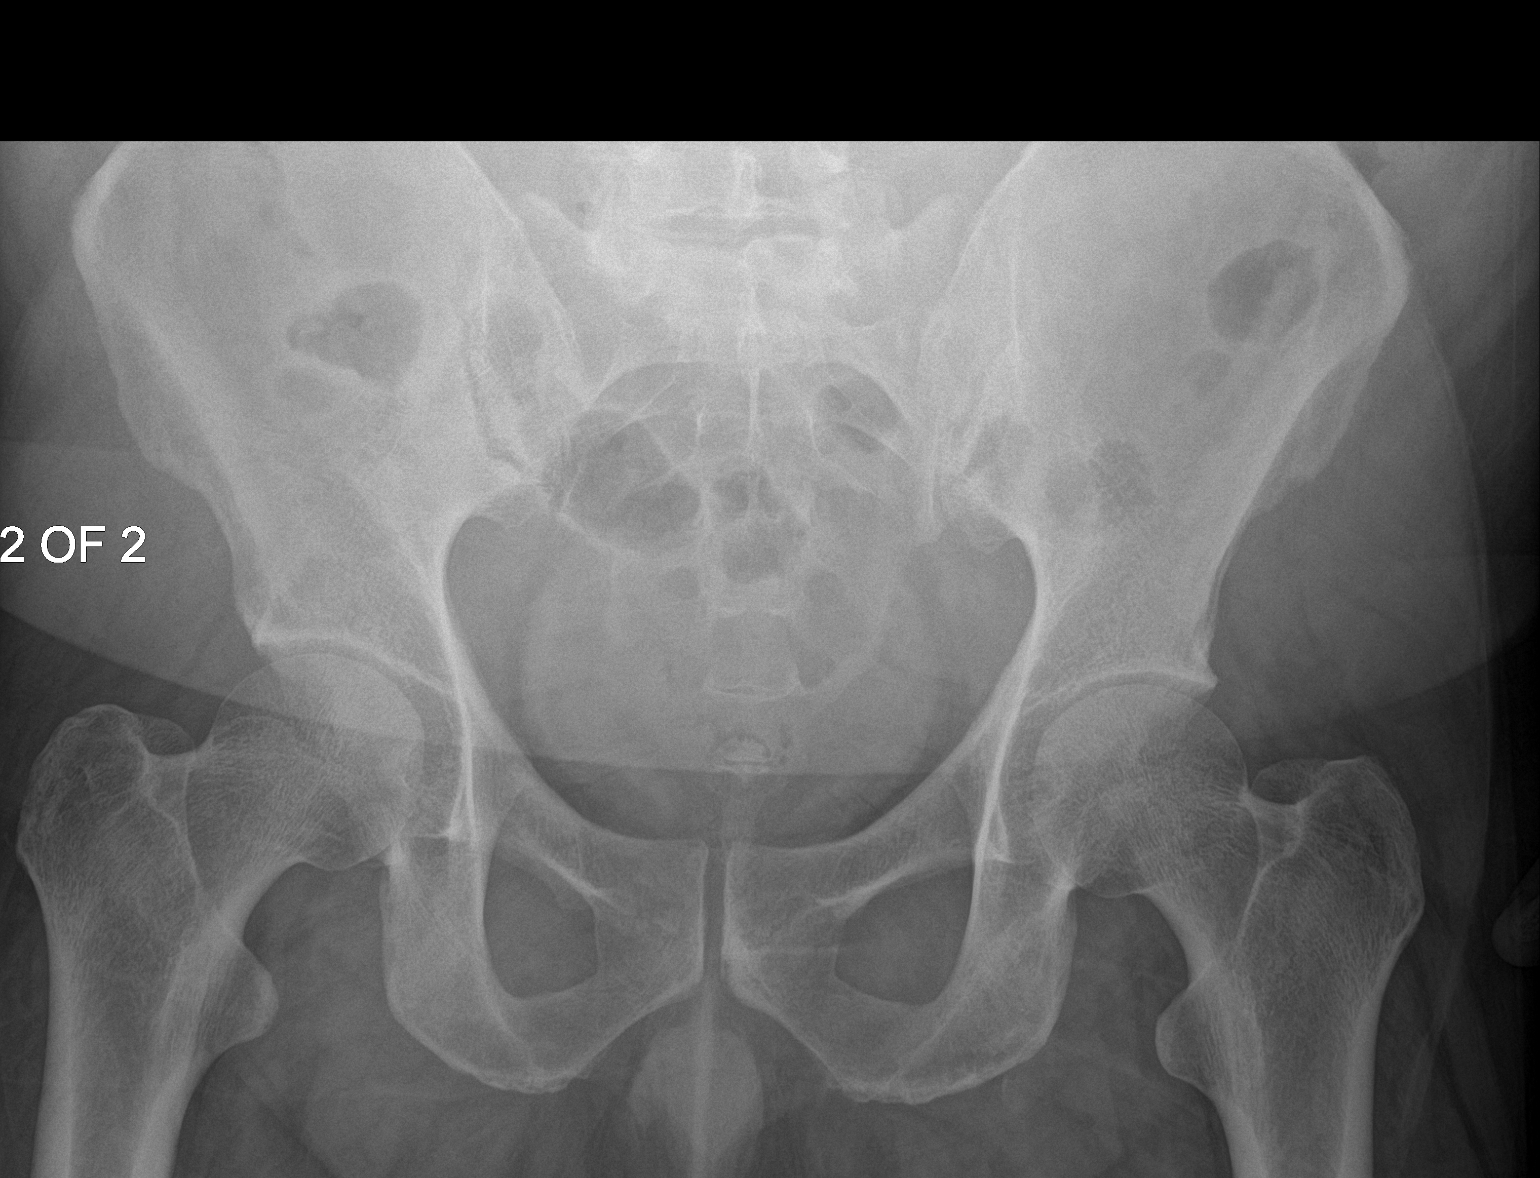

[3 of 3 positions shown; findings below may reference images not displayed]

FINDINGS: Bowel gas pattern is nonspecific. There is no small bowel dilation.
Small to moderate amount of stool is seen in the proximal colon.
There is no fecal impaction in the rectosigmoid. No abnormal masses
or calcifications are seen. Kidneys are partly obscured by bowel
contents limiting evaluation for small renal stones. Degenerative
changes are noted in the lumbar spine with bony spurs and facet
hypertrophy.
IMPRESSION: Nonspecific bowel gas pattern.

## 2023-08-26 ENCOUNTER — Ambulatory Visit: Admitting: Family Medicine

## 2023-08-26 ENCOUNTER — Encounter: Payer: Self-pay | Admitting: Family Medicine

## 2023-08-26 VITALS — BP 99/68 | HR 66 | Ht 71.0 in | Wt 239.4 lb

## 2023-08-26 DIAGNOSIS — R809 Proteinuria, unspecified: Secondary | ICD-10-CM

## 2023-08-26 DIAGNOSIS — E1129 Type 2 diabetes mellitus with other diabetic kidney complication: Secondary | ICD-10-CM | POA: Diagnosis not present

## 2023-08-26 DIAGNOSIS — I10 Essential (primary) hypertension: Secondary | ICD-10-CM

## 2023-08-26 LAB — BAYER DCA HB A1C WAIVED: HB A1C (BAYER DCA - WAIVED): 5.5 % (ref 4.8–5.6)

## 2023-08-26 MED ORDER — SEMAGLUTIDE(0.25 OR 0.5MG/DOS) 2 MG/3ML ~~LOC~~ SOPN: 0.5000 mL | PEN_INJECTOR | SUBCUTANEOUS | 1 refills | Status: DC

## 2023-08-26 MED ORDER — BENAZEPRIL HCL 10 MG PO TABS: 5.0000 mg | ORAL_TABLET | Freq: Every day | ORAL | 0 refills | Status: DC

## 2023-08-26 NOTE — Progress Notes (Signed)
 BP 99/68 (BP Location: Left Arm, Patient Position: Sitting, Cuff Size: Normal)   Pulse 66   Ht 5' 11 (1.803 m)   Wt 239 lb 6.4 oz (108.6 kg)   SpO2 92%   BMI 33.39 kg/m    Subjective:    Patient ID: Larry Cochran, male    DOB: [birth year], 63 y.o.   MRN: [id number]  HPI: Larry Cochran is a 64 y.o. male  Chief Complaint  Patient presents with   Diabetes    New dx 4/16   Hypotension   DIABETES- tolerating the ozempic  very well. Has had a bit of indigestion but is otherwise feeling well Hypoglycemic episodes:no Polydipsia/polyuria: no Visual disturbance: no Chest pain: no Paresthesias: no Glucose Monitoring: no  Accucheck frequency: Not Checking Taking Insulin?: no Blood Pressure Monitoring: not checking Retinal Examination: Not up to Date Foot Exam: Up to Date Diabetic Education: Completed Pneumovax: Not up to Date Influenza: Not up to Date Aspirin: no  HYPERTENSION  Hypertension status: overtreated  Satisfied with current treatment? yes Duration of hypertension: chronic BP monitoring frequency:  not checking BP medication side effects:  no Medication compliance: excellent compliance Previous BP meds:benazepril  Aspirin: yes Recurrent headaches: no Visual changes: no Palpitations: no Dyspnea: no Chest pain: no Lower extremity edema: no Dizzy/lightheaded: no   Relevant past medical, surgical, family and social history reviewed and updated as indicated. Interim medical history since our last visit reviewed. Allergies and medications reviewed and updated.  Review of Systems  Constitutional: Negative.   Respiratory: Negative.    Cardiovascular: Negative.   Musculoskeletal: Negative.   Psychiatric/Behavioral: Negative.      Per HPI unless specifically indicated above     Objective:    BP 99/68 (BP Location: Left Arm, Patient Position: Sitting, Cuff Size: Normal)   Pulse 66   Ht 5' 11 (1.803 m)   Wt 239 lb 6.4 oz (108.6 kg)   SpO2 92%   BMI  33.39 kg/m   Wt Readings from Last 3 Encounters:  08/26/23 239 lb 6.4 oz (108.6 kg)  07/15/23 235 lb (106.6 kg)  06/22/23 242 lb 3.2 oz (109.9 kg)    Physical Exam Vitals and nursing note reviewed.  Constitutional:      General: He is not in acute distress.    Appearance: Normal appearance. He is obese. He is not ill-appearing, toxic-appearing or diaphoretic.  HENT:     Head: Normocephalic and atraumatic.     Right Ear: External ear normal.     Left Ear: External ear normal.     Nose: Nose normal.     Mouth/Throat:     Mouth: Mucous membranes are moist.     Pharynx: Oropharynx is clear.   Eyes:     General: No scleral icterus.       Right eye: No discharge.        Left eye: No discharge.     Extraocular Movements: Extraocular movements intact.     Conjunctiva/sclera: Conjunctivae normal.     Pupils: Pupils are equal, round, and reactive to light.    Cardiovascular:     Rate and Rhythm: Normal rate and regular rhythm.     Pulses: Normal pulses.     Heart sounds: Normal heart sounds. No murmur heard.    No friction rub. No gallop.  Pulmonary:     Effort: Pulmonary effort is normal. No respiratory distress.     Breath sounds: Normal breath sounds. No stridor. No wheezing, rhonchi or rales.  Chest:  Chest wall: No tenderness.   Musculoskeletal:        General: Normal range of motion.     Cervical back: Normal range of motion and neck supple.   Skin:    General: Skin is warm and dry.     Capillary Refill: Capillary refill takes less than 2 seconds.     Coloration: Skin is not jaundiced or pale.     Findings: No bruising, erythema, lesion or rash.   Neurological:     General: No focal deficit present.     Mental Status: He is alert and oriented to person, place, and time. Mental status is at baseline.   Psychiatric:        Mood and Affect: Mood normal.        Behavior: Behavior normal.        Thought Content: Thought content normal.        Judgment: Judgment  normal.     Results for orders placed or performed during the hospital encounter of 07/08/23  PSA   Collection Time: 07/08/23 10:15 AM  Result Value Ref Range   Prostatic Specific Antigen 3.57 0.00 - 4.00 ng/mL  Hemoglobin and hematocrit, blood   Collection Time: 07/08/23 10:15 AM  Result Value Ref Range   Hemoglobin 14.3 13.0 - 17.0 g/dL   HCT 91.4 78.2 - 95.6 %  Testosterone    Collection Time: 07/08/23 10:15 AM  Result Value Ref Range   Testosterone  312 264 - 916 ng/dL      Assessment & Plan:   Problem List Items Addressed This Visit       Cardiovascular and Mediastinum   Hypertension   Running low- will cut his benazepril  in 1/2 and recheck in 3 months. Call with any concerns.       Relevant Medications   benazepril  (LOTENSIN ) 10 MG tablet     Endocrine   Controlled type 2 diabetes mellitus with microalbuminuria, without long-term current use of insulin (HCC) - Primary   Doing great with A1c of 5.5. Continue current regimen. Continue to monitor. Call with any concerns.       Relevant Medications   benazepril  (LOTENSIN ) 10 MG tablet   Semaglutide ,0.25 or 0.5MG /DOS, 2 MG/3ML SOPN   Other Relevant Orders   Bayer DCA Hb A1c Waived     Follow up plan: Return in about 3 months (around 11/26/2023).

## 2023-08-26 NOTE — Assessment & Plan Note (Signed)
 Doing great with A1c of 5.5. Continue current regimen. Continue to monitor. Call with any concerns.

## 2023-08-26 NOTE — Assessment & Plan Note (Signed)
 Running low- will cut his benazepril  in 1/2 and recheck in 3 months. Call with any concerns.

## 2023-08-29 DIAGNOSIS — G4733 Obstructive sleep apnea (adult) (pediatric): Secondary | ICD-10-CM | POA: Diagnosis not present

## 2023-08-31 DIAGNOSIS — E119 Type 2 diabetes mellitus without complications: Secondary | ICD-10-CM | POA: Diagnosis not present

## 2023-08-31 DIAGNOSIS — H2513 Age-related nuclear cataract, bilateral: Secondary | ICD-10-CM | POA: Diagnosis not present

## 2023-08-31 DIAGNOSIS — G4733 Obstructive sleep apnea (adult) (pediatric): Secondary | ICD-10-CM | POA: Diagnosis not present

## 2023-08-31 LAB — HM DIABETES EYE EXAM

## 2023-09-11 ENCOUNTER — Other Ambulatory Visit: Payer: Self-pay | Admitting: Family Medicine

## 2023-09-13 NOTE — Telephone Encounter (Signed)
 No longer current dosing of this medication Requested Prescriptions  Pending Prescriptions Disp Refills   benazepril  (LOTENSIN ) 20 MG tablet [Pharmacy Med Name: BENAZEPRIL  HCL 20 MG TABLET] 90 tablet 0    Sig: TAKE 1 TABLET BY MOUTH EVERY DAY     Cardiovascular:  ACE Inhibitors Passed - 09/13/2023  3:01 PM      Passed - Cr in normal range and within 180 days    Creatinine  Date Value Ref Range Status  04/19/2023 1.0 0.6 - 1.3 Final   Creatinine, Ser  Date Value Ref Range Status  12/17/2022 1.05 0.76 - 1.27 mg/dL Final         Passed - K in normal range and within 180 days    Potassium  Date Value Ref Range Status  04/19/2023 4.5 3.5 - 5.1 mEq/L Final         Passed - Patient is not pregnant      Passed - Last BP in normal range    BP Readings from Last 1 Encounters:  08/26/23 99/68         Passed - Valid encounter within last 6 months    Recent Outpatient Visits           2 weeks ago Controlled type 2 diabetes mellitus with microalbuminuria, without long-term current use of insulin (HCC)   [place] [facility] [place], Megan P, DO   2 months ago Routine general medical examination at a health care facility   [facility] [place], Duwaine SQUIBB, DO       Future Appointments             In 4 months McGowan, Clotilda DELENA RIGGERS [facility] [place]

## 2023-09-16 ENCOUNTER — Other Ambulatory Visit: Payer: Self-pay | Admitting: Family Medicine

## 2023-09-17 ENCOUNTER — Other Ambulatory Visit: Payer: Self-pay | Admitting: Family Medicine

## 2023-09-17 NOTE — Telephone Encounter (Signed)
 Last RF 09/15/23 #90 3 RF  Requested Prescriptions  Refused Prescriptions Disp Refills   benazepril  (LOTENSIN ) 10 MG tablet [Pharmacy Med Name: BENAZEPRIL  HCL 10 MG TABLET] 90 tablet     Sig: TAKE 1 TABLET BY MOUTH EVERY DAY     Cardiovascular:  ACE Inhibitors Passed - 09/17/2023  1:28 PM      Passed - Cr in normal range and within 180 days    Creatinine  Date Value Ref Range Status  04/19/2023 1.0 0.6 - 1.3 Final   Creatinine, Ser  Date Value Ref Range Status  12/17/2022 1.05 0.76 - 1.27 mg/dL Final         Passed - K in normal range and within 180 days    Potassium  Date Value Ref Range Status  04/19/2023 4.5 3.5 - 5.1 mEq/L Final         Passed - Patient is not pregnant      Passed - Last BP in normal range    BP Readings from Last 1 Encounters:  08/26/23 99/68         Passed - Valid encounter within last 6 months    Recent Outpatient Visits           3 weeks ago Controlled type 2 diabetes mellitus with microalbuminuria, without long-term current use of insulin (HCC)   [place] [facility] [place], Megan P, DO   2 months ago Routine general medical examination at a health care facility   [place] [facility] Vicci Duwaine SQUIBB, DO       Future Appointments             In 4 months McGowan, Clotilda DELENA RIGGERS [facility] [place]

## 2023-09-19 NOTE — Telephone Encounter (Signed)
 Requested Prescriptions  Pending Prescriptions Disp Refills   rosuvastatin  (CRESTOR ) 5 MG tablet [Pharmacy Med Name: ROSUVASTATIN  CALCIUM  5 MG TAB] 90 tablet 0    Sig: TAKE 1 TABLET (5 MG TOTAL) BY MOUTH DAILY.     Cardiovascular:  Antilipid - Statins 2 Failed - 09/19/2023  3:42 PM      Failed - Lipid Panel in normal range within the last 12 months    Cholesterol, Total  Date Value Ref Range Status  06/11/2022 193 100 - 199 mg/dL Final   Cholesterol  Date Value Ref Range Status  04/19/2023 274 (A) 0 - 200 Final   LDL Chol Calc (NIH)  Date Value Ref Range Status  06/11/2022 121 (H) 0 - 99 mg/dL Final   LDL Cholesterol  Date Value Ref Range Status  04/19/2023 165  Final   HDL  Date Value Ref Range Status  04/19/2023 37 35 - 70 Final  06/11/2022 37 (L) >39 mg/dL Final   Triglycerides  Date Value Ref Range Status  04/19/2023 372 (A) 40 - 160 Final         Passed - Cr in normal range and within 360 days    Creatinine  Date Value Ref Range Status  04/19/2023 1.0 0.6 - 1.3 Final   Creatinine, Ser  Date Value Ref Range Status  12/17/2022 1.05 0.76 - 1.27 mg/dL Final         Passed - Patient is not pregnant      Passed - Valid encounter within last 12 months    Recent Outpatient Visits           3 weeks ago Controlled type 2 diabetes mellitus with microalbuminuria, without long-term current use of insulin (HCC)   [place] [facility] [place], Megan P, DO   2 months ago Routine general medical examination at a health care facility   [facility] [place], Duwaine SQUIBB, DO       Future Appointments             In 4 months McGowan, Clotilda DELENA RIGGERS [facility]rology [place]

## 2023-09-28 DIAGNOSIS — G4733 Obstructive sleep apnea (adult) (pediatric): Secondary | ICD-10-CM | POA: Diagnosis not present

## 2023-10-17 ENCOUNTER — Other Ambulatory Visit: Payer: Self-pay | Admitting: Family Medicine

## 2023-10-20 ENCOUNTER — Other Ambulatory Visit: Payer: Self-pay

## 2023-10-20 NOTE — Telephone Encounter (Signed)
 Copied from CRM [id number]. Topic: Clinical - Prescription Issue >> Oct 20, 2023  9:35 AM Emylou G wrote: Reason for CRM: Patient called.. denied his refill for ozempic .. said refill too soon?  He says only has 1 shot left?  Pls call/advise.

## 2023-10-23 MED ORDER — SEMAGLUTIDE(0.25 OR 0.5MG/DOS) 2 MG/3ML ~~LOC~~ SOPN: 0.5000 mL | PEN_INJECTOR | SUBCUTANEOUS | 1 refills | Status: DC

## 2023-10-29 DIAGNOSIS — G4733 Obstructive sleep apnea (adult) (pediatric): Secondary | ICD-10-CM | POA: Diagnosis not present

## 2023-11-01 ENCOUNTER — Telehealth: Payer: Self-pay

## 2023-11-01 DIAGNOSIS — E1129 Type 2 diabetes mellitus with other diabetic kidney complication: Secondary | ICD-10-CM

## 2023-11-01 NOTE — Telephone Encounter (Signed)
 Copied from CRM #[id number]. Topic: Clinical - Medication Question >> Oct 27, 2023  9:06 AM Myrick T wrote: Reason for CRM: Moses from [facility] called to get clarification on the instructions for Semaglutide ,0.25 or 0.5MG /DOS, 2 MG/3ML. Jolynn stated the patient is not able to inject ML's. Please call pharmacy to advise [phone].

## 2023-11-02 NOTE — Telephone Encounter (Signed)
 Spoke with [facility]. Taken care of

## 2023-11-10 NOTE — Telephone Encounter (Signed)
 Copied from CRM [id number]. Topic: Clinical - Prescription Issue >> Nov 01, 2023  9:04 AM Dawna HERO wrote: Reason for CRM: patient states he hasn't received his prescription for Ozempic , says that pharmacy stated it had something to do with the dosage on prescription. Please call back saturnino

## 2023-11-10 NOTE — Telephone Encounter (Signed)
 Spoke with patient. He confirms that the issue has been resolved and has no needs at this time.

## 2023-11-30 ENCOUNTER — Other Ambulatory Visit: Payer: Self-pay | Admitting: Family Medicine

## 2023-12-01 NOTE — Telephone Encounter (Signed)
 Requested medication (s) are due for refill today: Yes  Requested medication (s) are on the active medication list: Yes  Last refill:  06/22/23  Future visit scheduled: yes  Notes to clinic:  Manual review    Requested Prescriptions  Pending Prescriptions Disp Refills   Vitamin D , Ergocalciferol , (DRISDOL ) 1.25 MG (50000 UNIT) CAPS capsule [Pharmacy Med Name: VITAMIN D2 1.25MG (50,000 UNIT)] 12 capsule 1    Sig: Take 1 capsule (50,000 Units total) by mouth every 7 (seven) days.     Endocrinology:  Vitamins - Vitamin D  Supplementation 2 Failed - 12/01/2023  1:09 PM      Failed - Manual Review: Route requests for 50,000 IU strength to the provider      Passed - Ca in normal range and within 360 days    Calcium   Date Value Ref Range Status  04/19/2023 9.4  Final  04/19/2023 9.4 8.7 - 10.7 Final         Passed - Vitamin D  in normal range and within 360 days    Vit D, 25-Hydroxy  Date Value Ref Range Status  04/19/2023 19.4  Final         Passed - Valid encounter within last 12 months    Recent Outpatient Visits           3 months ago Controlled type 2 diabetes mellitus with microalbuminuria, without long-term current use of insulin (HCC)   [place] [facility] [place], Megan P, DO   5 months ago Routine general medical examination at a health care facility   [facility] Duwaine SQUIBB, DO       Future Appointments             In 2 months McGowan, Clotilda DELENA RIGGERS [facility] [place]

## 2023-12-02 ENCOUNTER — Ambulatory Visit: Admitting: Family Medicine

## 2023-12-02 ENCOUNTER — Encounter: Payer: Self-pay | Admitting: Family Medicine

## 2023-12-02 VITALS — BP 108/73 | HR 68 | Ht 71.0 in | Wt 241.3 lb

## 2023-12-02 DIAGNOSIS — R42 Dizziness and giddiness: Secondary | ICD-10-CM | POA: Diagnosis not present

## 2023-12-02 DIAGNOSIS — E1129 Type 2 diabetes mellitus with other diabetic kidney complication: Secondary | ICD-10-CM | POA: Diagnosis not present

## 2023-12-02 DIAGNOSIS — Z23 Encounter for immunization: Secondary | ICD-10-CM

## 2023-12-02 DIAGNOSIS — I10 Essential (primary) hypertension: Secondary | ICD-10-CM

## 2023-12-02 DIAGNOSIS — R809 Proteinuria, unspecified: Secondary | ICD-10-CM | POA: Diagnosis not present

## 2023-12-02 LAB — BAYER DCA HB A1C WAIVED: HB A1C (BAYER DCA - WAIVED): 5.9 % — ABNORMAL HIGH (ref 4.8–5.6)

## 2023-12-02 MED ORDER — ROSUVASTATIN CALCIUM 5 MG PO TABS: 5.0000 mg | ORAL_TABLET | Freq: Every day | ORAL | 0 refills | Status: DC

## 2023-12-02 MED ORDER — LANSOPRAZOLE 15 MG PO CPDR: 15.0000 mg | DELAYED_RELEASE_CAPSULE | Freq: Every day | ORAL | 3 refills | Status: AC

## 2023-12-02 MED ORDER — TAMSULOSIN HCL 0.4 MG PO CAPS: 0.4000 mg | ORAL_CAPSULE | Freq: Every day | ORAL | 1 refills | Status: AC

## 2023-12-02 MED ORDER — SEMAGLUTIDE(0.25 OR 0.5MG/DOS) 2 MG/3ML ~~LOC~~ SOPN: 0.5000 mL | PEN_INJECTOR | SUBCUTANEOUS | 1 refills | Status: DC

## 2023-12-02 NOTE — Assessment & Plan Note (Signed)
 Doing great with A1c of 5.9. Continue current regimen. Call with any concerns. Continue to monitor.

## 2023-12-02 NOTE — Assessment & Plan Note (Signed)
 Running low. Will stop his benazepril . Recheck 6 months. Call with any concerns.

## 2023-12-02 NOTE — Progress Notes (Signed)
 BP 108/73 (BP Location: Left Arm, Patient Position: Sitting, Cuff Size: Large)   Pulse 68   Ht 5' 11 (1.803 m)   Wt 241 lb 4.8 oz (109.5 kg)   SpO2 94%   BMI 33.65 kg/m    Subjective:    Patient ID: Larry Cochran, male    DOB: 1960/09/22, 63 y.o.   MRN: [id number]  HPI: Larry Cochran is a 63 y.o. male  Chief Complaint  Patient presents with   Follow-up    Diabetes type II and A1C   Dizziness    Light headed, intermittent, thinks it may be bp medication    DIZZINESS Duration: months Description of symptoms: lightheaded Duration of episode: minutes Dizziness frequency: recurrent- a few times a week Provoking factors: unsure Aggravating factors:  unsure Triggered by rolling over in bed: no Triggered by bending over: no Aggravated by head movement: no Aggravated by exertion, coughing, loud noises: no Recent head injury: no Recent or current viral symptoms: no History of vasovagal episodes: no Nausea: no Vomiting: no Tinnitus: no Hearing loss: no Aural fullness: no Headache: no Photophobia/phonophobia: no Unsteady gait: no Postural instability: no Diplopia, dysarthria, dysphagia or weakness: no Related to exertion: no Pallor: no Diaphoresis: no Dyspnea: no Chest pain: no  DIABETES Hypoglycemic episodes:no Polydipsia/polyuria: no Visual disturbance: no Chest pain: no Paresthesias: no Glucose Monitoring: no Taking Insulin?: no Blood Pressure Monitoring: not checking Retinal Examination: Up to Date Foot Exam: Up to Date Diabetic Education: Completed Pneumovax: Not up to Date Influenza: Up to Date Aspirin: yes  HYPERTENSION / HYPERLIPIDEMIA Satisfied with current treatment? yes Duration of hypertension: chronic BP monitoring frequency: not checking BP medication side effects: no Past BP meds: benazepril  Duration of hyperlipidemia: chronic Cholesterol medication side effects: no Cholesterol supplements: none Past cholesterol medications:  crestor  Medication compliance: excellent compliance Aspirin: yes Recent stressors: no Recurrent headaches: no Visual changes: no Palpitations: no Dyspnea: no Chest pain: no Lower extremity edema: no Dizzy/lightheaded: yes  Relevant past medical, surgical, family and social history reviewed and updated as indicated. Interim medical history since our last visit reviewed. Allergies and medications reviewed and updated.  Review of Systems  Constitutional: Negative.   Respiratory: Negative.    Cardiovascular: Negative.   Musculoskeletal: Negative.   Neurological: Negative.   Psychiatric/Behavioral: Negative.      Per HPI unless specifically indicated above     Objective:    BP 108/73 (BP Location: Left Arm, Patient Position: Sitting, Cuff Size: Large)   Pulse 68   Ht 5' 11 (1.803 m)   Wt 241 lb 4.8 oz (109.5 kg)   SpO2 94%   BMI 33.65 kg/m   Wt Readings from Last 3 Encounters:  12/02/23 241 lb 4.8 oz (109.5 kg)  08/26/23 239 lb 6.4 oz (108.6 kg)  07/15/23 235 lb (106.6 kg)    Physical Exam Vitals and nursing note reviewed.  Constitutional:      General: He is not in acute distress.    Appearance: Normal appearance. He is obese. He is not ill-appearing, toxic-appearing or diaphoretic.  HENT:     Head: Normocephalic and atraumatic.     Right Ear: External ear normal.     Left Ear: External ear normal.     Nose: Nose normal.     Mouth/Throat:     Mouth: Mucous membranes are moist.     Pharynx: Oropharynx is clear.  Eyes:     General: No scleral icterus.       Right eye: No discharge.  Left eye: No discharge.     Extraocular Movements: Extraocular movements intact.     Conjunctiva/sclera: Conjunctivae normal.     Pupils: Pupils are equal, round, and reactive to light.  Cardiovascular:     Rate and Rhythm: Normal rate and regular rhythm.     Pulses: Normal pulses.     Heart sounds: Normal heart sounds. No murmur heard.    No friction rub. No gallop.   Pulmonary:     Effort: Pulmonary effort is normal. No respiratory distress.     Breath sounds: Normal breath sounds. No stridor. No wheezing, rhonchi or rales.  Chest:     Chest wall: No tenderness.  Musculoskeletal:        General: Normal range of motion.     Cervical back: Normal range of motion and neck supple.  Skin:    General: Skin is warm and dry.     Capillary Refill: Capillary refill takes less than 2 seconds.     Coloration: Skin is not jaundiced or pale.     Findings: No bruising, erythema, lesion or rash.  Neurological:     General: No focal deficit present.     Mental Status: He is alert and oriented to person, place, and time. Mental status is at baseline.  Psychiatric:        Mood and Affect: Mood normal.        Behavior: Behavior normal.        Thought Content: Thought content normal.        Judgment: Judgment normal.     Results for orders placed or performed in visit on 12/02/23  Bayer DCA Hb A1c Waived   Collection Time: 12/02/23  8:40 AM  Result Value Ref Range   HB A1C (BAYER DCA - WAIVED) 5.9 (H) 4.8 - 5.6 %      Assessment & Plan:   Problem List Items Addressed This Visit       Cardiovascular and Mediastinum   Hypertension   Running low. Will stop his benazepril . Recheck 6 months. Call with any concerns.      Relevant Medications   rosuvastatin  (CRESTOR ) 5 MG tablet     Endocrine   Controlled type 2 diabetes mellitus with microalbuminuria, without long-term current use of insulin (HCC) - Primary   Doing great with A1c of 5.9. Continue current regimen. Call with any concerns. Continue to monitor.       Relevant Medications   rosuvastatin  (CRESTOR ) 5 MG tablet   Semaglutide ,0.25 or 0.5MG /DOS, 2 MG/3ML SOPN   Other Relevant Orders   Bayer DCA Hb A1c Waived (Completed)   Comprehensive metabolic panel with GFR   CBC with Differential/Platelet   Lipid Panel w/o Chol/HDL Ratio   Other Visit Diagnoses       Dizziness       Still happening-  will stop his benazepril . Call with any concerns.     Flu vaccine need       Flu shot given today.   Relevant Orders   Flu vaccine trivalent PF, 6mos and older(Flulaval,Afluria,Fluarix,Fluzone) (Completed)        Follow up plan: Return in about 7 months (around 06/25/2024) for physical.

## 2023-12-03 LAB — CBC WITH DIFFERENTIAL/PLATELET
Basophils Absolute: 0.1 x10E3/uL (ref 0.0–0.2)
Basos: 1 %
EOS (ABSOLUTE): 0.2 x10E3/uL (ref 0.0–0.4)
Eos: 2 %
Hematocrit: 42.8 % (ref 37.5–51.0)
Hemoglobin: 14.2 g/dL (ref 13.0–17.7)
Immature Grans (Abs): 0 x10E3/uL (ref 0.0–0.1)
Immature Granulocytes: 0 %
Lymphocytes Absolute: 2 x10E3/uL (ref 0.7–3.1)
Lymphs: 30 %
MCH: 29.6 pg (ref 26.6–33.0)
MCHC: 33.2 g/dL (ref 31.5–35.7)
MCV: 89 fL (ref 79–97)
Monocytes Absolute: 0.4 x10E3/uL (ref 0.1–0.9)
Monocytes: 6 %
Neutrophils Absolute: 4.1 x10E3/uL (ref 1.4–7.0)
Neutrophils: 61 %
Platelets: 232 x10E3/uL (ref 150–450)
RBC: 4.8 x10E6/uL (ref 4.14–5.80)
RDW: 13.1 % (ref 11.6–15.4)
WBC: 6.7 x10E3/uL (ref 3.4–10.8)

## 2023-12-03 LAB — COMPREHENSIVE METABOLIC PANEL WITH GFR
ALT: 21 IU/L (ref 0–44)
AST: 14 IU/L (ref 0–40)
Albumin: 4.2 g/dL (ref 3.9–4.9)
Alkaline Phosphatase: 88 IU/L (ref 47–123)
BUN/Creatinine Ratio: 16 (ref 10–24)
BUN: 16 mg/dL (ref 8–27)
Bilirubin Total: 0.5 mg/dL (ref 0.0–1.2)
CO2: 23 mmol/L (ref 20–29)
Calcium: 8.9 mg/dL (ref 8.6–10.2)
Chloride: 101 mmol/L (ref 96–106)
Creatinine, Ser: 0.98 mg/dL (ref 0.76–1.27)
Globulin, Total: 2.1 g/dL (ref 1.5–4.5)
Glucose: 98 mg/dL (ref 70–99)
Potassium: 4.4 mmol/L (ref 3.5–5.2)
Sodium: 138 mmol/L (ref 134–144)
Total Protein: 6.3 g/dL (ref 6.0–8.5)
eGFR: 88 mL/min/1.73 (ref >59)

## 2023-12-03 LAB — LIPID PANEL W/O CHOL/HDL RATIO
Cholesterol, Total: 153 mg/dL (ref 100–199)
HDL: 40 mg/dL (ref >39)
LDL Chol Calc (NIH): 87 mg/dL (ref 0–99)
Triglycerides: 147 mg/dL (ref 0–149)
VLDL Cholesterol Cal: 26 mg/dL (ref 5–40)

## 2023-12-05 ENCOUNTER — Ambulatory Visit: Payer: Self-pay | Admitting: Family Medicine

## 2023-12-05 DIAGNOSIS — G4733 Obstructive sleep apnea (adult) (pediatric): Secondary | ICD-10-CM | POA: Diagnosis not present

## 2023-12-05 DIAGNOSIS — R42 Dizziness and giddiness: Secondary | ICD-10-CM | POA: Diagnosis not present

## 2023-12-05 DIAGNOSIS — I2089 Other forms of angina pectoris: Secondary | ICD-10-CM | POA: Diagnosis not present

## 2023-12-06 DIAGNOSIS — R42 Dizziness and giddiness: Secondary | ICD-10-CM | POA: Diagnosis not present

## 2023-12-06 DIAGNOSIS — R0789 Other chest pain: Secondary | ICD-10-CM | POA: Diagnosis not present

## 2023-12-10 ENCOUNTER — Other Ambulatory Visit: Payer: Self-pay | Admitting: Family Medicine

## 2023-12-12 NOTE — Telephone Encounter (Signed)
 No longer listed on current medication list Requested Prescriptions  Pending Prescriptions Disp Refills   benazepril  (LOTENSIN ) 10 MG tablet [Pharmacy Med Name: BENAZEPRIL  HCL 10 MG TABLET] 45 tablet 0    Sig: TAKE 1/2 TABLET BY MOUTH DAILY     Cardiovascular:  ACE Inhibitors Passed - 12/12/2023  3:21 PM      Passed - Cr in normal range and within 180 days    Creatinine, Ser  Date Value Ref Range Status  12/02/2023 0.98 0.76 - 1.27 mg/dL Final         Passed - K in normal range and within 180 days    Potassium  Date Value Ref Range Status  12/02/2023 4.4 3.5 - 5.2 mmol/L Final         Passed - Patient is not pregnant      Passed - Last BP in normal range    BP Readings from Last 1 Encounters:  12/02/23 108/73         Passed - Valid encounter within last 6 months    Recent Outpatient Visits           1 week ago Controlled type 2 diabetes mellitus with microalbuminuria, without long-term current use of insulin (HCC)   [place] [facility] [place], Megan P, DO   3 months ago Controlled type 2 diabetes mellitus with microalbuminuria, without long-term current use of insulin (HCC)   [place] [facility] [place], Megan P, DO   5 months ago Routine general medical examination at a health care facility   [facility] [place], Duwaine SQUIBB, DO       Future Appointments             In 1 month McGowan, Clotilda DELENA RIGGERS [facility] [place]

## 2023-12-15 DIAGNOSIS — R42 Dizziness and giddiness: Secondary | ICD-10-CM | POA: Diagnosis not present

## 2023-12-15 DIAGNOSIS — R0789 Other chest pain: Secondary | ICD-10-CM | POA: Diagnosis not present

## 2024-01-01 DIAGNOSIS — R0789 Other chest pain: Secondary | ICD-10-CM | POA: Diagnosis not present

## 2024-01-01 DIAGNOSIS — R42 Dizziness and giddiness: Secondary | ICD-10-CM | POA: Diagnosis not present

## 2024-01-06 ENCOUNTER — Telehealth: Payer: Self-pay | Admitting: Family Medicine

## 2024-01-06 NOTE — Telephone Encounter (Signed)
 Copied from CRM #[id number]. Topic: Clinical - Medication Question >> Jan 06, 2024 12:34 PM Jakyia R wrote: Pt calling to request a increase dosage of  Semaglutide  0.5MG  to see if itll help with weigh loss.

## 2024-01-09 NOTE — Telephone Encounter (Signed)
 Routing to provider to advise if a medication increase is appropriate.

## 2024-01-09 NOTE — Telephone Encounter (Signed)
 We had called in the 0.5mg  in September- it's the same pen, he just needs to dial up to the 0.5

## 2024-01-15 MED ORDER — SEMAGLUTIDE (1 MG/DOSE) 4 MG/3ML ~~LOC~~ SOPN: 1.0000 mg | PEN_INJECTOR | SUBCUTANEOUS | 2 refills | Status: DC

## 2024-01-27 ENCOUNTER — Other Ambulatory Visit

## 2024-01-27 DIAGNOSIS — N400 Enlarged prostate without lower urinary tract symptoms: Secondary | ICD-10-CM

## 2024-01-27 DIAGNOSIS — R972 Elevated prostate specific antigen [PSA]: Secondary | ICD-10-CM | POA: Diagnosis not present

## 2024-01-27 DIAGNOSIS — E291 Testicular hypofunction: Secondary | ICD-10-CM

## 2024-01-28 LAB — TESTOSTERONE: Testosterone: 471 ng/dL (ref 264–916)

## 2024-01-28 LAB — PSA: Prostate Specific Ag, Serum: 3.9 ng/mL (ref 0.0–4.0)

## 2024-01-29 NOTE — Progress Notes (Unsigned)
 01/31/2024 11:56 AM   Larry Cochran [birth year] [id number]  Referring provider: Vicci Duwaine SQUIBB, DO [address] [place],  [place] [address]  Urological history: 1. Hypogonadism - testosterone  level (01/2024) 471 - hemoglobin/hematocrit (11/2023) 14.6/45.6  2. BPH with LU TS - PSA (01/2024) 3.9 - prostate volume 75 cc, prostate MRI (2025) - Tamsulosin  0.4 mg daily  3. Elevated PSA - PSA (01/2023) 4.1  - prostate MRI (07/2023) negative, PSAD 0.052  4. Erectile dysfunction - sildenafil  20 mg, on-demand-dosing  Chief Complaint  Patient presents with   Hypogonadism   HPI: Larry Cochran is a 63 y.o. man who presents today for 6 month follow up.   Previous records reviewed.  I PSS 8/1  He reports urinary frequency, urinary intermittency, urinary urgency, and nocturia x 1.  Patient denies any modifying or aggravating factors.  Patient denies any recent UTI's, gross hematuria, dysuria or suprapubic/flank pain.  Patient denies any fevers, chills, nausea or vomiting.    He does have a family history of prostate cancer.  PSA  3.9  Serum creatinine (11/2023) 1.0  Hemoglobin A1c (11/2023) 5.9  BPH meds: tamsulosin  0.4 mg daily   SHIM 17  He does not have confidence that he could get and keep an erection, his erections are not firm enough for penetrative intercourse, he has difficulty maintaining his erections, and he is not finding intercourse satisfactory for him.  He feels that he and his wife sex drive has diminished since they are getting older.  Patient is not having spontaneous erections.  He denies any pain or curvature with erections.  He has no pain with ejaculation or blood in his ejaculate fluid.  Testosterone  level 471  Cholesterol (11/2023) normal   TSH (11/2023) 2.06  He is taking sildenafil  20 mg daily and is satisfied with his sexual life at this time.   PMH: Past Medical History:  Diagnosis Date   Hypertension    Low back pain     Surgical  History: Past Surgical History:  Procedure Laterality Date   MOHS SURGERY     SKIN BIOPSY     SKIN LESION EXCISION      Home Medications:  Allergies as of 01/31/2024   No Known Allergies      Medication List        Accurate as of January 31, 2024 11:56 AM. If you have any questions, ask your nurse or doctor.          STOP taking these medications    rosuvastatin  5 MG tablet Commonly known as: CRESTOR  Stopped by: Catori Panozzo   Testosterone  20.25 MG/1.25GM (1.62%) Gel Stopped by: CLOTILDA Mack Thurmon       TAKE these medications    aspirin EC 81 MG tablet Take 81 mg by mouth daily.   benazepril  10 MG tablet Commonly known as: LOTENSIN  Take 5 mg by mouth.   clobetasol 0.05 % external solution Commonly known as: TEMOVATE Apply topically.   ketoconazole 2 % shampoo Commonly known as: NIZORAL   lansoprazole  15 MG capsule Commonly known as: PREVACID  Take 1 capsule (15 mg total) by mouth daily at 12 noon.   NONFORMULARY OR COMPOUNDED ITEM Testosterone  20 % compounded cream  Apply 1 cc twice daily   Semaglutide  (1 MG/DOSE) 4 MG/3ML Sopn Inject 1 mg as directed once a week.   sildenafil  20 MG tablet Commonly known as: REVATIO  Take 1 tablet (20 mg total) by mouth daily. What changed:  when to take this Another medication  with the same name was removed. Continue taking this medication, and follow the directions you see here. Changed by: CLOTILDA CORNWALL   tamsulosin  0.4 MG Caps capsule Commonly known as: FLOMAX  Take 1 capsule (0.4 mg total) by mouth daily. What changed: Another medication with the same name was added. Make sure you understand how and when to take each. Changed by: CLOTILDA CORNWALL   tamsulosin  0.4 MG Caps capsule Commonly known as: FLOMAX  Take 1 capsule (0.4 mg total) by mouth daily. What changed: You were already taking a medication with the same name, and this prescription was added. Make sure you understand how and when to take  each. Changed by: Malisha Mabey   Vitamin D  (Ergocalciferol ) 1.25 MG (50000 UNIT) Caps capsule Commonly known as: DRISDOL  TAKE 1 CAPSULE (50,000 UNITS TOTAL) BY MOUTH EVERY 7 (SEVEN) DAYS   Xyosted  50 MG/0.5ML Soaj Generic drug: Testosterone  Enanthate Inject 50 mg into the skin every 7 (seven) days. Started by: CLOTILDA CORNWALL        Allergies: No Known Allergies  Family History: Family History  Problem Relation Age of Onset   AAA (abdominal aortic aneurysm) Mother    Diabetes Father    Bladder Cancer Brother    Benign prostatic hyperplasia Brother    Diabetes Paternal Grandmother    Heart disease Paternal Grandmother    Diabetes Paternal Grandfather     Social History:  reports that he quit smoking about 14 years ago. His smoking use included cigars. He has never used smokeless tobacco. He reports current alcohol use. He reports that he does not use drugs.  ROS: Pertinent ROS in HPI  Physical Exam: BP 109/74   Pulse 81   Ht 5' 11 (1.803 m)   Wt 239 lb (108.4 kg)   BMI 33.33 kg/m   Constitutional:  Well nourished. Alert and oriented, No acute distress. HEENT: [place] AT, moist mucus membranes.  Trachea midline Cardiovascular: No clubbing, cyanosis, or edema. Respiratory: Normal respiratory effort, no increased work of breathing. Neurologic: Grossly intact, no focal deficits, moving all 4 extremities. Psychiatric: Normal mood and affect.  Laboratory Data: See Epic and HPI   I have reviewed the labs.   Pertinent Imaging: N/A  Assessment & Plan:    1. Hypogonadism  -testosterone  levels are therapeutic -H & H normal  - He would like to switch to Xyosted  at this time because his insurance is not covering the compounded testosterone  cream - I sent a prescription for the Xyosted  50 mg SQ every 7 days  2. BPH with LUTS -PSA stable -continue conservative management, avoiding bladder irritants and timed voiding's -Continue tamsulosin  0.4 mg daily  3.  Erectile dysfunction:    - Continue sildenafil  20 mg daily  Return for PSA, testosterone , hemoglobin, hematocrit, IPSS , SHIM and exam.  These notes generated with voice recognition software. I apologize for typographical errors.  CLOTILDA CORNWALL RIGGERS  [facility] [address]  [address] [place], [place] [address] [phone]

## 2024-01-31 ENCOUNTER — Encounter: Payer: Self-pay | Admitting: Urology

## 2024-01-31 ENCOUNTER — Ambulatory Visit: Admitting: Urology

## 2024-01-31 VITALS — BP 109/74 | HR 81 | Ht 71.0 in | Wt 239.0 lb

## 2024-01-31 DIAGNOSIS — E291 Testicular hypofunction: Secondary | ICD-10-CM

## 2024-01-31 DIAGNOSIS — N528 Other male erectile dysfunction: Secondary | ICD-10-CM | POA: Diagnosis not present

## 2024-01-31 DIAGNOSIS — N400 Enlarged prostate without lower urinary tract symptoms: Secondary | ICD-10-CM | POA: Diagnosis not present

## 2024-01-31 MED ORDER — SILDENAFIL CITRATE 20 MG PO TABS
20.0000 mg | ORAL_TABLET | Freq: Every day | ORAL | 3 refills | Status: AC
Start: 2024-01-31 — End: ?

## 2024-01-31 MED ORDER — TAMSULOSIN HCL 0.4 MG PO CAPS: 0.4000 mg | ORAL_CAPSULE | Freq: Every day | ORAL | 3 refills | Status: AC

## 2024-01-31 MED ORDER — XYOSTED 50 MG/0.5ML ~~LOC~~ SOAJ: 50.0000 mg | SUBCUTANEOUS | 1 refills | Status: AC

## 2024-02-06 ENCOUNTER — Other Ambulatory Visit: Payer: Self-pay | Admitting: Urology

## 2024-02-06 DIAGNOSIS — E291 Testicular hypofunction: Secondary | ICD-10-CM

## 2024-02-06 MED ORDER — NONFORMULARY OR COMPOUNDED ITEM: 5 refills | Status: DC

## 2024-02-10 ENCOUNTER — Other Ambulatory Visit: Payer: Self-pay | Admitting: Urology

## 2024-02-10 DIAGNOSIS — E291 Testicular hypofunction: Secondary | ICD-10-CM

## 2024-02-10 MED ORDER — NONFORMULARY OR COMPOUNDED ITEM: 5 refills | Status: AC

## 2024-02-18 ENCOUNTER — Other Ambulatory Visit: Payer: Self-pay | Admitting: Family Medicine

## 2024-02-21 NOTE — Telephone Encounter (Signed)
 Requested medication (s) are due for refill today - unsure  Requested medication (s) are on the active medication list -yes  Future visit scheduled -yes  Last refill: 12/01/23 #12  Notes to clinic: high dose medication- requires PCP review   Requested Prescriptions  Pending Prescriptions Disp Refills   Vitamin D , Ergocalciferol , (DRISDOL ) 1.25 MG (50000 UNIT) CAPS capsule [Pharmacy Med Name: VITAMIN D2 1.25MG (50,000 UNIT)] 12 capsule 0    Sig: TAKE 1 CAPSULE (50,000 UNITS TOTAL) BY MOUTH EVERY 7 (SEVEN) DAYS     Endocrinology:  Vitamins - Vitamin D  Supplementation 2 Failed - 02/21/2024 11:47 AM      Failed - Manual Review: Route requests for 50,000 IU strength to the provider      Passed - Ca in normal range and within 360 days    Calcium   Date Value Ref Range Status  12/02/2023 8.9 8.6 - 10.2 mg/dL Final  97/88/7974 9.4  Final         Passed - Vitamin D  in normal range and within 360 days    Vit D, 25-Hydroxy  Date Value Ref Range Status  04/19/2023 19.4  Final         Passed - Valid encounter within last 12 months    Recent Outpatient Visits           2 months ago Controlled type 2 diabetes mellitus with microalbuminuria, without long-term current use of insulin (HCC)   [place] [facility] [place], Megan P, DO   5 months ago Controlled type 2 diabetes mellitus with microalbuminuria, without long-term current use of insulin (HCC)   [place] [facility] [place], Megan P, DO   8 months ago Routine general medical examination at a health care facility   [facility] [place], [place] P, DO       Future Appointments             In 5 months McGowan, Clotilda LABOR, PA-C [facility]               Requested Prescriptions  Pending Prescriptions Disp Refills   Vitamin D , Ergocalciferol , (DRISDOL ) 1.25 MG (50000 UNIT) CAPS capsule [Pharmacy Med Name: VITAMIN D2 1.25MG (50,000 UNIT)] 12 capsule 0     Sig: TAKE 1 CAPSULE (50,000 UNITS TOTAL) BY MOUTH EVERY 7 (SEVEN) DAYS     Endocrinology:  Vitamins - Vitamin D  Supplementation 2 Failed - 02/21/2024 11:47 AM      Failed - Manual Review: Route requests for 50,000 IU strength to the provider      Passed - Ca in normal range and within 360 days    Calcium   Date Value Ref Range Status  12/02/2023 8.9 8.6 - 10.2 mg/dL Final  97/88/7974 9.4  Final         Passed - Vitamin D  in normal range and within 360 days    Vit D, 25-Hydroxy  Date Value Ref Range Status  04/19/2023 19.4  Final         Passed - Valid encounter within last 12 months    Recent Outpatient Visits           2 months ago Controlled type 2 diabetes mellitus with microalbuminuria, without long-term current use of insulin (HCC)   [place] [facility] [place], Megan P, DO   5 months ago Controlled type 2 diabetes mellitus with microalbuminuria, without long-term current use of insulin [facility])   [facility] [place], Megan  P, DO   8 months ago Routine general medical examination at a health care facility   [facility] [place], Duwaine SQUIBB, DO       Future Appointments             In 5 months McGowan, Clotilda DELENA RIGGERS [facility]

## 2024-04-04 ENCOUNTER — Other Ambulatory Visit: Payer: Self-pay | Admitting: Family Medicine

## 2024-04-04 NOTE — Telephone Encounter (Signed)
 Requested Prescriptions  Pending Prescriptions Disp Refills   OZEMPIC , 1 MG/DOSE, 4 MG/3ML SOPN [Pharmacy Med Name: OZEMPIC  4 MG/3 ML (1 MG/DOSE)] 3 mL 2    Sig: INJECT 1 MG ONCE A WEEK AS DIRECTED     Endocrinology:  Diabetes - GLP-1 Receptor Agonists - semaglutide  Failed - 04/04/2024  4:14 PM      Failed - HBA1C in normal range and within 180 days    Hemoglobin A1C  Date Value Ref Range Status  04/19/2023 6.7  Final   HB A1C (BAYER DCA - WAIVED)  Date Value Ref Range Status  12/02/2023 5.9 (H) 4.8 - 5.6 % Final    Comment:             Prediabetes: 5.7 - 6.4          Diabetes: >6.4          Glycemic control for adults with diabetes: <7.0    A1c  Date Value Ref Range Status  04/19/2023 6.7  Final         Passed - Cr in normal range and within 360 days    Creatinine, Ser  Date Value Ref Range Status  12/02/2023 0.98 0.76 - 1.27 mg/dL Final         Passed - Valid encounter within last 6 months    Recent Outpatient Visits           4 months ago Controlled type 2 diabetes mellitus with microalbuminuria, without long-term current use of insulin (HCC)   [place] [facility] [place], Megan P, DO   7 months ago Controlled type 2 diabetes mellitus with microalbuminuria, without long-term current use of insulin (HCC)   [place] [facility] [place], Megan P, DO   9 months ago Routine general medical examination at a health care facility   [facility] [place], Duwaine SQUIBB, DO       Future Appointments             In 4 months McGowan, Clotilda DELENA RIGGERS [facility] [place]

## 2024-06-22 ENCOUNTER — Encounter: Admitting: Family Medicine

## 2024-07-31 ENCOUNTER — Other Ambulatory Visit

## 2024-08-03 ENCOUNTER — Ambulatory Visit: Admitting: Urology
